# Patient Record
Sex: Female | Born: 1965
Health system: Southern US, Community
[De-identification: ages and names within clinical notes are randomized; demographics above are authoritative.]

## PROBLEM LIST (undated history)

## (undated) DIAGNOSIS — M797 Fibromyalgia: Secondary | ICD-10-CM

## (undated) DIAGNOSIS — K219 Gastro-esophageal reflux disease without esophagitis: Secondary | ICD-10-CM

## (undated) DIAGNOSIS — M1990 Unspecified osteoarthritis, unspecified site: Secondary | ICD-10-CM

## (undated) DIAGNOSIS — R112 Nausea with vomiting, unspecified: Secondary | ICD-10-CM

## (undated) DIAGNOSIS — R079 Chest pain, unspecified: Secondary | ICD-10-CM

## (undated) DIAGNOSIS — Z9889 Other specified postprocedural states: Secondary | ICD-10-CM

## (undated) DIAGNOSIS — I10 Essential (primary) hypertension: Secondary | ICD-10-CM

## (undated) DIAGNOSIS — E039 Hypothyroidism, unspecified: Secondary | ICD-10-CM

## (undated) HISTORY — DX: Hypothyroidism, unspecified: E03.9

## (undated) HISTORY — PX: NASAL SINUS SURGERY: SHX719

## (undated) HISTORY — DX: Fibromyalgia: M79.7

## (undated) HISTORY — DX: Essential (primary) hypertension: I10

## (undated) HISTORY — PX: BREAST ENHANCEMENT SURGERY: SHX7

## (undated) HISTORY — PX: AUGMENTATION MAMMAPLASTY: SUR837

## (undated) HISTORY — PX: EYE SURGERY: SHX253

## (undated) HISTORY — PX: LEFT OOPHORECTOMY: SHX1961

---

## 1993-02-12 HISTORY — PX: ABDOMINAL HYSTERECTOMY: SHX81

## 2000-06-13 ENCOUNTER — Encounter: Payer: Self-pay | Admitting: Gastroenterology

## 2000-06-13 ENCOUNTER — Encounter: Admission: RE | Admit: 2000-06-13 | Discharge: 2000-06-13 | Payer: Self-pay | Admitting: Gastroenterology

## 2000-07-03 ENCOUNTER — Ambulatory Visit (HOSPITAL_COMMUNITY): Admission: RE | Admit: 2000-07-03 | Discharge: 2000-07-03 | Payer: Self-pay | Admitting: Gastroenterology

## 2002-02-12 HISTORY — PX: AUGMENTATION MAMMAPLASTY: SUR837

## 2004-02-29 ENCOUNTER — Ambulatory Visit: Payer: Self-pay | Admitting: Family Medicine

## 2004-05-24 ENCOUNTER — Ambulatory Visit: Payer: Self-pay | Admitting: Urology

## 2004-11-15 ENCOUNTER — Ambulatory Visit: Payer: Self-pay | Admitting: Internal Medicine

## 2005-03-15 ENCOUNTER — Ambulatory Visit: Payer: Self-pay | Admitting: Otolaryngology

## 2008-10-25 ENCOUNTER — Ambulatory Visit: Payer: Self-pay | Admitting: Gastroenterology

## 2009-10-20 ENCOUNTER — Ambulatory Visit: Payer: Self-pay | Admitting: Unknown Physician Specialty

## 2009-10-24 ENCOUNTER — Ambulatory Visit: Payer: Self-pay | Admitting: Unknown Physician Specialty

## 2009-12-16 ENCOUNTER — Ambulatory Visit: Payer: Self-pay

## 2010-01-02 ENCOUNTER — Ambulatory Visit: Payer: Self-pay

## 2011-06-07 ENCOUNTER — Ambulatory Visit: Payer: Self-pay

## 2011-09-19 LAB — CBC
HCT: 37.3 % (ref 35.0–47.0)
HGB: 13.2 g/dL (ref 12.0–16.0)
MCH: 32.5 pg (ref 26.0–34.0)
MCHC: 35.5 g/dL (ref 32.0–36.0)
MCV: 92 fL (ref 80–100)
Platelet: 208 10*3/uL (ref 150–440)
RBC: 4.07 10*6/uL (ref 3.80–5.20)
RDW: 13.2 % (ref 11.5–14.5)
WBC: 6.4 10*3/uL (ref 3.6–11.0)

## 2011-09-19 LAB — BASIC METABOLIC PANEL
Anion Gap: 5 — ABNORMAL LOW (ref 7–16)
BUN: 11 mg/dL (ref 7–18)
Calcium, Total: 8.9 mg/dL (ref 8.5–10.1)
Chloride: 107 mmol/L (ref 98–107)
Co2: 31 mmol/L (ref 21–32)
Creatinine: 0.7 mg/dL (ref 0.60–1.30)
EGFR (African American): >60
EGFR (Non-African Amer.): >60
Glucose: 101 mg/dL — ABNORMAL HIGH (ref 65–99)
Osmolality: 285 (ref 275–301)
Potassium: 4.1 mmol/L (ref 3.5–5.1)
Sodium: 143 mmol/L (ref 136–145)

## 2011-09-19 LAB — TROPONIN I
Troponin-I: <0.02 ng/mL
Troponin-I: <0.02 ng/mL

## 2011-09-20 ENCOUNTER — Observation Stay: Payer: Self-pay | Admitting: Specialist

## 2011-09-20 LAB — TROPONIN I
Troponin-I: <0.02 ng/mL
Troponin-I: <0.02 ng/mL

## 2011-09-20 LAB — PROTIME-INR
INR: 1
Prothrombin Time: 14 secs (ref 11.5–14.7)

## 2011-09-20 LAB — LIPID PANEL
Cholesterol: 166 mg/dL (ref 0–200)
HDL Cholesterol: 38 mg/dL — ABNORMAL LOW (ref 40–60)
Ldl Cholesterol, Calc: 82 mg/dL (ref 0–100)
Triglycerides: 231 mg/dL — ABNORMAL HIGH (ref 0–200)
VLDL Cholesterol, Calc: 46 mg/dL — ABNORMAL HIGH (ref 5–40)

## 2011-09-20 LAB — TSH: Thyroid Stimulating Horm: 2.65 u[IU]/mL

## 2011-09-20 LAB — APTT: Activated PTT: 30.3 secs (ref 23.6–35.9)

## 2012-02-21 ENCOUNTER — Other Ambulatory Visit: Payer: Self-pay | Admitting: Gastroenterology

## 2012-02-23 LAB — STOOL CULTURE

## 2012-09-12 ENCOUNTER — Ambulatory Visit: Payer: Self-pay | Admitting: Family Medicine

## 2013-02-27 ENCOUNTER — Ambulatory Visit: Payer: Self-pay | Admitting: Otolaryngology

## 2013-08-26 IMAGING — US US EXTREM LOW VENOUS BILAT
1 series · 14 of 24 positions shown · non-contrast
Comparison: none

REASON FOR EXAM: positive d dimer
COMMENTS:

PROCEDURE:     US  - US DOPPLER LOW EXTR BILATERAL  - September 19, 2011  [DATE]
RESULT:     History: Positive d-dimer.
Comparison Study: No recent.

[Series 1: us extrem low venous bilat · 0.10mm/px · 14 of 46 slices shown]
[im 1/46]
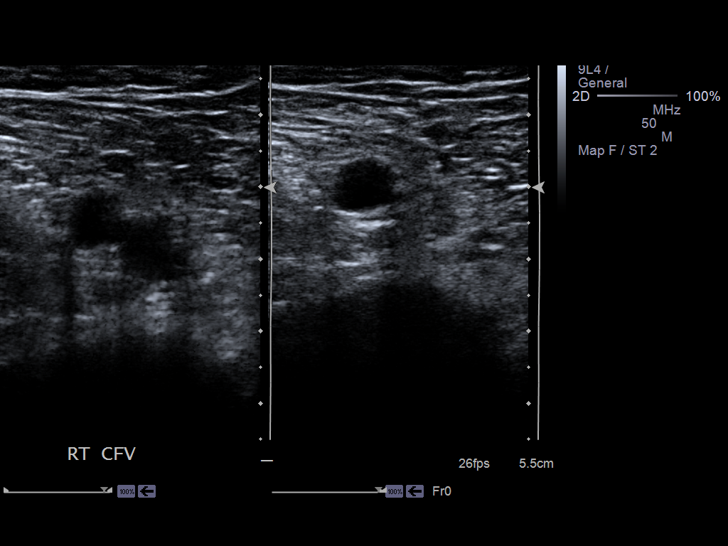
[im 4/46]
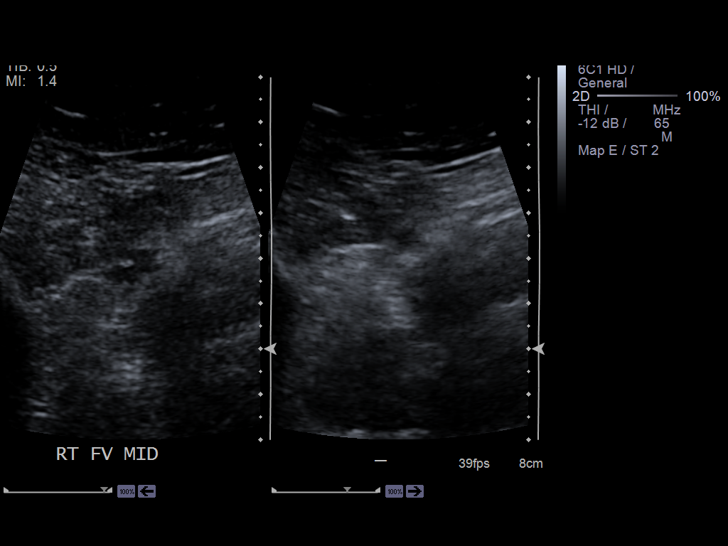
[im 8/46]
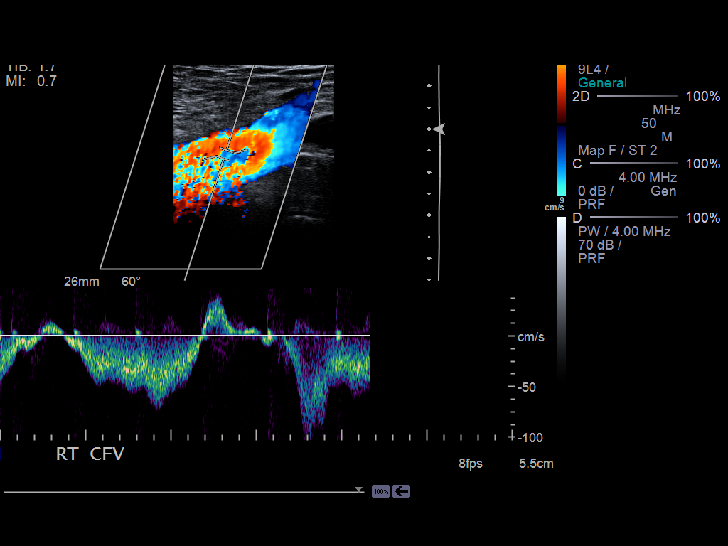
[im 12/46]
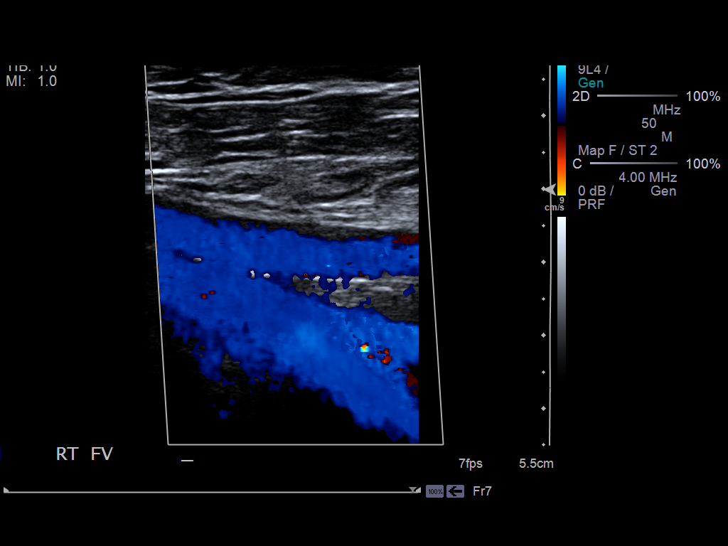
[im 14/46]
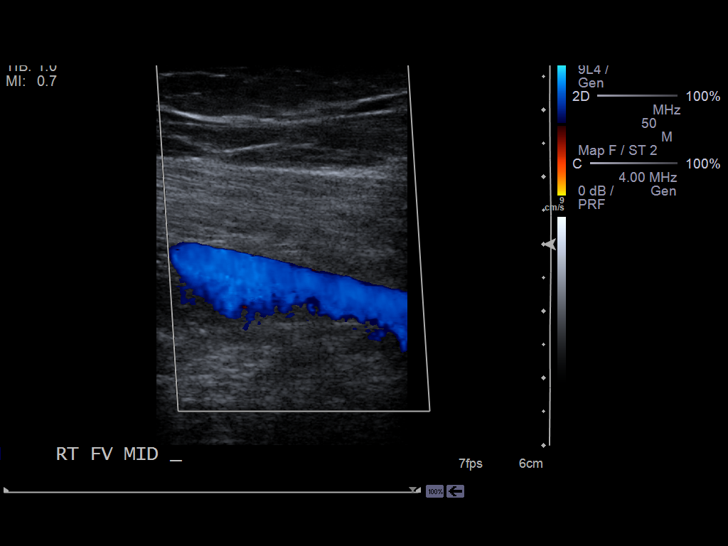
[im 18/46]
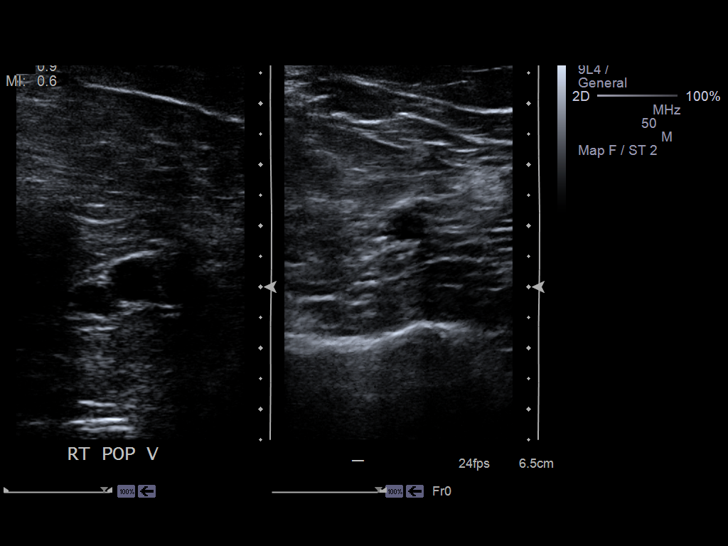
[im 22/46]
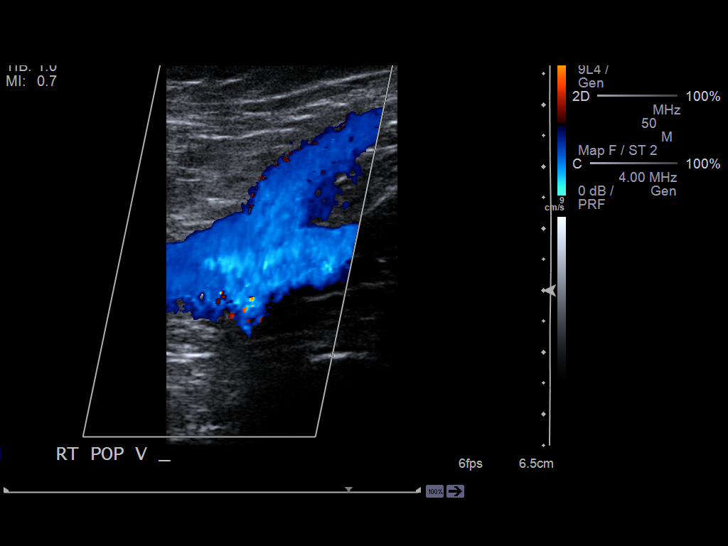
[im 24/46]
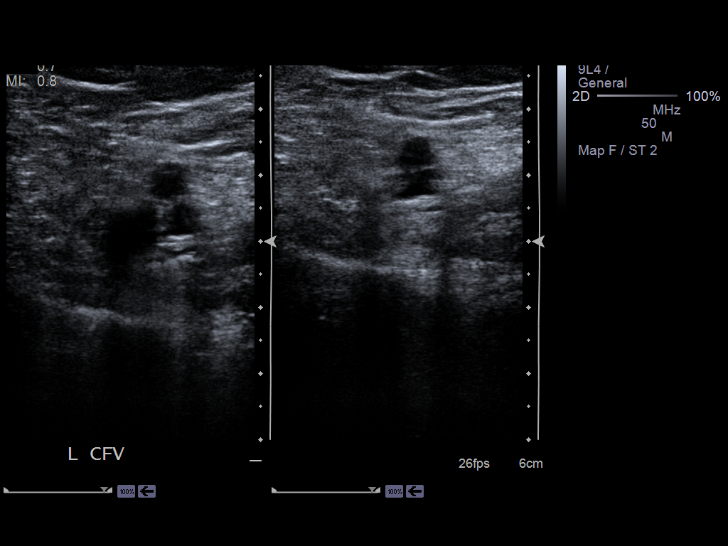
[im 28/46]
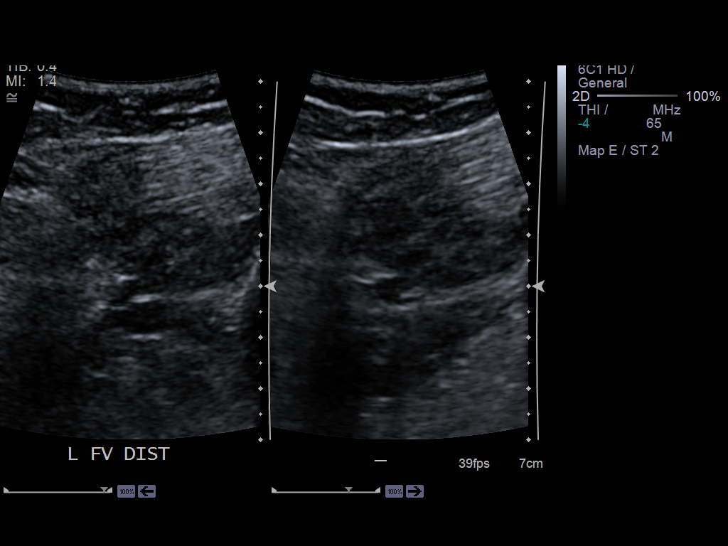
[im 32/46]
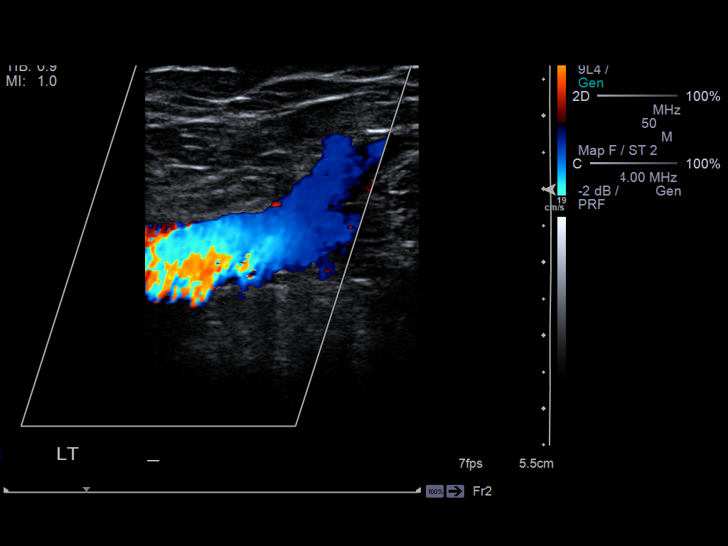
[im 36/46]
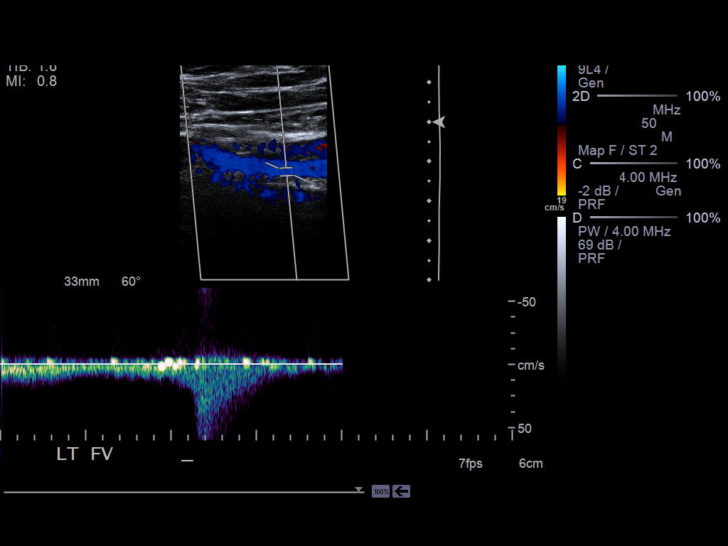
[im 38/46]
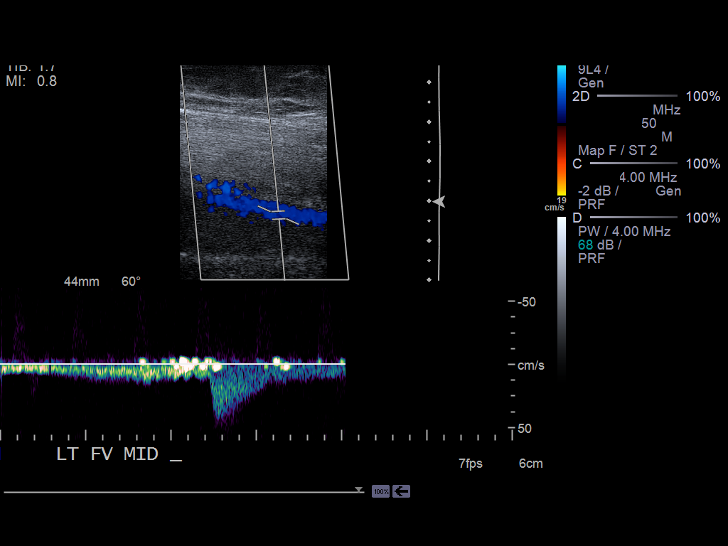
[im 42/46]
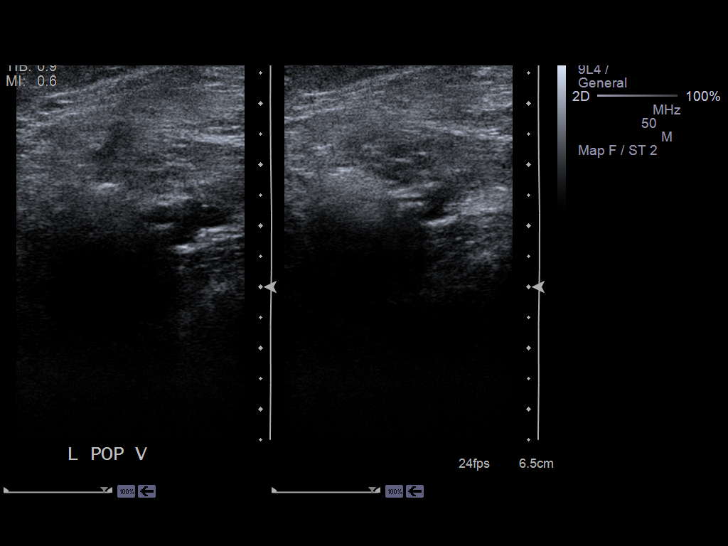
[im 46/46]
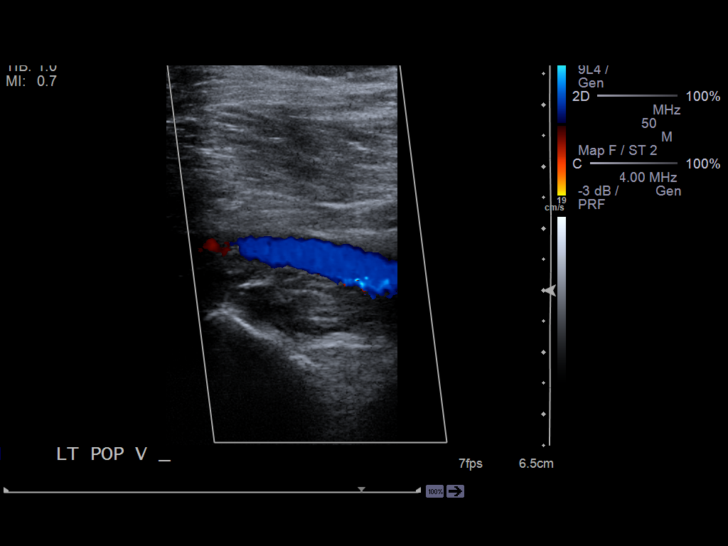

[14 of 24 positions shown; findings below may reference images not displayed]

FINDINGS: Bilateral lower extremity color flow duplex Doppler is negative
for deep venous thrombosis.
IMPRESSION: Negative exam.

## 2013-08-27 IMAGING — NM NM LUNG SCAN
2 series · 16 of 16 positions shown · non-contrast
Comparison: none

REASON FOR EXAM: Chest pain with elev dd imer
COMMENTS:

[Series 1000: lung ventilation · 3.90mm/px · 4 acquisitions, 8 frames shown]
[im 1/4]
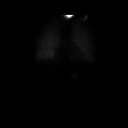
[im 1/4]
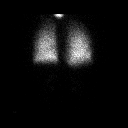
[im 2/4]
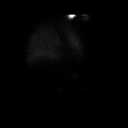
[im 2/4]
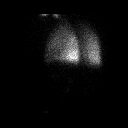
[im 3/4]
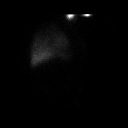
[im 3/4]
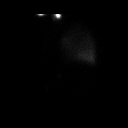
[im 4/4]
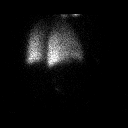
[im 4/4]
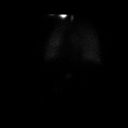

[Series 1000: lung perfusion · 1.95mm/px · 4 acquisitions, 8 frames shown]
[im 1/4]
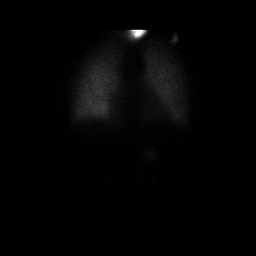
[im 1/4]
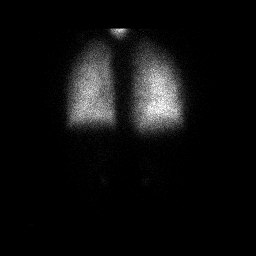
[im 2/4]
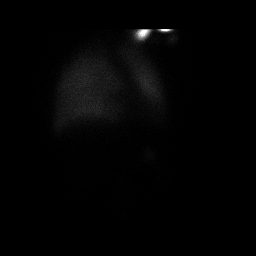
[im 2/4]
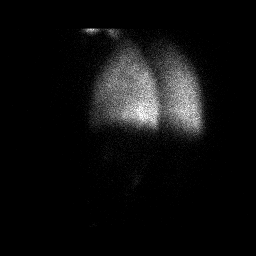
[im 3/4]
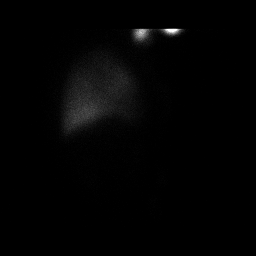
[im 3/4]
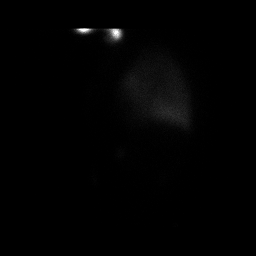
[im 4/4]
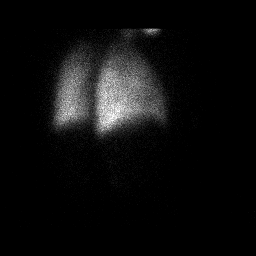
[im 4/4]
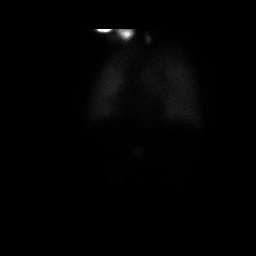

[16 of 16 positions shown; findings below may reference images not displayed]

PROCEDURE:     NM  - NM VQ LUNG SCAN  - [DATE]  [DATE] [DATE]  [DATE]

RESULT:     Comparison is made to the chest x-ray 19 September, 2011.

41.68 mCi of technetium 99 M DTPA were utilized for the ventilation images.
4.38 mCi of technetium 99 M MAA were injected for perfusion images.

Chest radiograph shows normal aeration. Ventilation pattern appears normal.
Perfusion pattern appears normal.
IMPRESSION: 1. Normal-appearing ventilation/perfusion lung scan.

[REDACTED]

## 2013-09-21 ENCOUNTER — Ambulatory Visit: Payer: Self-pay | Admitting: Family Medicine

## 2014-06-01 NOTE — Discharge Summary (Signed)
PATIENT NAME:  [name], Melanie E MR#:  [id number] DATE OF BIRTH:  05-05-1965  DATE OF ADMISSION:  09/20/2011 DATE OF DISCHARGE:  09/20/2011  For a detailed note, please see the history and physical done on admission by Dr. Tilda FrancoAkuneme.   DIAGNOSES AT DISCHARGE:  1. Chest pain, likely musculoskeletal in nature.  2. Migraines. 3. Asthma. 4. Rosacea.   DIET: The patient is being discharged on a regular diet.   ACTIVITY: As tolerated.   DISCHARGE MEDICATIONS:  1. Tylenol with hydrocodone 5/500 one tab as needed. 2. Aleve as needed.  3. Fioricet 2 tabs q.4 hours as needed for headache.   PERTINENT STUDIES DONE DURING THE HOSPITAL COURSE:  1. Chest x-ray done on admission showing no acute cardiopulmonary disease.  2. An ultrasound of her lower extremities showed no evidence of any DVT.  3. A V/Q scan which showed normal appearing V/Q scan with no abnormalities.   HOSPITAL COURSE: This is a 49 year old female who presented to the hospital with intermittent chest pain along with a headache.  1. Chest pain. The patient's chest pain is likely musculoskeletal in nature and probably related to stress. She was observed on telemetry, had three sets of cardiac markers checked which were negative. She had no acute EKG changes consistent with cardiac ischemia. She was noted to have a slightly elevated D-dimer, underwent Doppler's of her lower extremities which were negative. She could not have a CT scan of the chest with contrast as she is allergic to shellfish, therefore, she had a V/Q scan which was essentially normal. Her chest pain is likely musculoskeletal in nature and she will continue her pain meds as stated above.  2. Headache and nausea. This was likely suspected to be related to migraine headaches. She improved with some Zofran and some Fioricet. I did give her a prescription of Fioricet prior to discharge.  3. Asthma. There were no acute issues related to her asthma. She had no acute  exacerbation. She is on albuterol inhaler as needed at home. She will continue that.   CODE STATUS: The patient is a FULL CODE.   TIME SPENT: 35 minutes.   ____________________________ Rolly PancakeVivek J. Cherlynn KaiserSainani, MD vjs:drc D: 09/20/2011 16:07:18 ET T: 09/21/2011 10:52:43 ET JOB#: [id number]  cc: Rolly PancakeVivek J. Cherlynn KaiserSainani, MD, <Dictator> Houston SirenVIVEK J Zoya Sprecher MD ELECTRONICALLY SIGNED 09/21/2011 16:14

## 2014-06-01 NOTE — H&P (Signed)
PATIENT NAME:  Melanie Beltran, Melanie Beltran MR#:  [id number] DATE OF BIRTH:  1965-08-13  DATE OF ADMISSION:  09/20/2011  PRIMARY CARE PHYSICIAN: Dr. Marguerite Olea   ER PHYSICIAN: Dr. Marilynne Halsted    ADMITTING PHYSICIAN: Dr. Tilda Franco   PRESENTING COMPLAINT: Left-sided chest pain for the last three days.  HISTORY OF PRESENT ILLNESS: The patient is a 49 year old lady with complaint of left-sided chest pain for three days now. Pain was sharp in nature, initially left-sided radiating to the left shoulder with associated nausea but no vomiting. No dizziness. No blackouts. No palpitation. No PND, orthopnea, or pedal edema. Intensity was 10/10 relieved by rest. No aggravating factor for this. She presented to the Emergency Room today where she had EKG which showed normal sinus rhythm. Venous Doppler showed no DVT. D-dimer was elevated at 0.8. Preparation was being made for a CT scan, however, the patient was noted to be allergic to shellfish. She was referred to hospitalist for CT scan in a.m. Pain is relieved by morphine here in the ER. Denies any recent change in medication. No history of sick contacts. Admits to some long distance travel. Was in [place] about three weeks ago and to the beach about two weeks ago but no leg swelling during this episode. No rashes or trauma to the chest.  REVIEW OF SYSTEMS: CONSTITUTIONAL: Denies any fever or fatigue. No weakness. No weight loss or weight gain but only positive for the pain. EYES: No blurred vision, redness, or discharge. ENT: No tinnitus, epistaxis, or difficulty swallowing. RESPIRATORY: Denies any cough or wheezing. Admits to pain on deep inspiration. CARDIOVASCULAR: No PND, orthopnea, or pedal edema. No palpitations or syncope. GI: Positive for nausea but no vomiting, diarrhea, abdominal pain, or change in bowel habits. GU: No dysuria, frequency, or incontinence. ENDOCRINE: No polyuria, polydipsia, heat or cold intolerance. HEMATOLOGIC: No anemia, easy bruising, or swollen glands.  SKIN: No rashes, change in hair or skin texture. MUSCULOSKELETAL: No redness of joints, limited activity, swelling, or gout. NEUROLOGIC: No numbness, weakness, dementia, seizures, or memory loss. PSYCH: No anxiety, depression, or nervousness.    PAST MEDICAL HISTORY:  1. Asthma. 2. Rosacea.   PAST SURGICAL HISTORY:  1. Left arm surgery. 2. Sinus surgery.  3. History of cataract surgery.  4. Hysterectomy.   SOCIAL HISTORY: Lives at home with her family. No alcohol, tobacco, or recreational drug use. Owns a plumbing business.    FAMILY HISTORY: Reviewed and noncontributory.   ALLERGIES: Shellfish and Asacol.   MEDICATIONS: Aleve and acetaminophen which she takes p.r.n.   PHYSICAL EXAMINATION:   VITAL SIGNS: Temperature 98.9, pulse 91, respiratory rate 18, blood pressure 120/63, sats 98 on room air.   GENERAL: Young lady lying on the gurney awake, alert, oriented to time, place, and person in no distress. Family at bedside, supportive.   HEENT: Atraumatic, normocephalic. Pupils equal, reactive to light and accommodation. Extraocular movements intact. Mucous membranes pink, moist.   NECK: Supple. No JV distention.   CHEST: Good air entry. Clear to auscultation. There is tenderness left anterior chest wall.  HEART: Regular rate and rhythm. No murmurs.   ABDOMEN: Full, moves with respiration, nontender. Bowel sounds normoactive. No organomegaly.   EXTREMITIES: No edema. No clubbing. No deformity.   NEUROLOGICAL: No focal motor or sensory deficits.   PSYCH: Affect appropriate to situation.    SKIN: No rashes on the chest.  LABORATORY, DIAGNOSTIC, AND RADIOLOGICAL DATA: EKG showed normal sinus rhythm of 90, P wave inversion in V1 and V2.   Chest  x-ray showed no obvious infiltrate.   Venous Doppler bilateral lower extremities showed no DVT.   CBC unremarkable. Chemistry unremarkable. Creatinine 0.7, BUN 11, glucose 101, potassium 4.1. Troponin two sets negative. D-dimer is  elevated at 0.8.   IMPRESSION:  1. Chest pain, to rule out PE versus ACS. 2. Elevated D-dimer concerning for PE.  3. History of asthma, stable.  4. History of rosacea, stable.   PLAN:  1. Admit to general medical floor under observation. 2. V/Q scan in the a.m.  3. Serial cardiac enzymes.  4. Check TSH, magnesium, fasting lipid profile. 5. Aspirin and nitroglycerin. 6. P.r.n. morphine. 7. Lovenox for anticoagulation, weight based, 1 mg/kg subcu. 8. GI prophylaxis with Protonix.   CODE STATUS: FULL CODE.      TOTAL PATIENT CARE TIME: 50 minutes.   ____________________________ Floy SabinaMarcel I. Tilda FrancoAkuneme, MD mia:drc D: 09/20/2011 01:38:34 ET T: 09/20/2011 06:57:37 ET JOB#: [id number]  cc: Kioni Stahl I. Tilda FrancoAkuneme, MD, <Dictator> Durward MallardJoel B. Marguerite OleaMoffett, MD Margaret PyleMARCEL I Stokes Rattigan MD ELECTRONICALLY SIGNED 09/23/2011 20:29

## 2014-06-17 ENCOUNTER — Other Ambulatory Visit: Payer: Self-pay | Admitting: Adult Health

## 2014-06-17 ENCOUNTER — Other Ambulatory Visit: Payer: Self-pay | Admitting: Family Medicine

## 2014-06-17 DIAGNOSIS — M7989 Other specified soft tissue disorders: Secondary | ICD-10-CM

## 2014-06-18 ENCOUNTER — Ambulatory Visit
Admission: RE | Admit: 2014-06-18 | Discharge: 2014-06-18 | Disposition: A | Discharge status: Home / Self Care | Payer: BLUE CROSS/BLUE SHIELD | Source: Ambulatory Visit | Attending: Family Medicine | Admitting: Family Medicine

## 2014-06-18 DIAGNOSIS — M7989 Other specified soft tissue disorders: Secondary | ICD-10-CM | POA: Diagnosis not present

## 2014-06-22 ENCOUNTER — Ambulatory Visit: Payer: Self-pay

## 2015-11-09 ENCOUNTER — Other Ambulatory Visit: Payer: Self-pay | Admitting: Adult Health

## 2015-11-09 DIAGNOSIS — R51 Headache: Principal | ICD-10-CM

## 2015-11-09 DIAGNOSIS — R519 Headache, unspecified: Secondary | ICD-10-CM

## 2015-11-10 ENCOUNTER — Ambulatory Visit
Admission: RE | Admit: 2015-11-10 | Discharge: 2015-11-10 | Disposition: A | Discharge status: Home / Self Care | Payer: Commercial Managed Care - HMO | Source: Ambulatory Visit | Attending: Adult Health | Admitting: Adult Health

## 2015-11-10 ENCOUNTER — Encounter: Payer: Self-pay | Admitting: Radiology

## 2015-11-10 DIAGNOSIS — G936 Cerebral edema: Secondary | ICD-10-CM | POA: Insufficient documentation

## 2015-11-10 DIAGNOSIS — R519 Headache, unspecified: Secondary | ICD-10-CM

## 2015-11-10 DIAGNOSIS — R51 Headache: Secondary | ICD-10-CM | POA: Insufficient documentation

## 2015-11-10 DIAGNOSIS — Z9889 Other specified postprocedural states: Secondary | ICD-10-CM | POA: Diagnosis not present

## 2015-11-10 DIAGNOSIS — R930 Abnormal findings on diagnostic imaging of skull and head, not elsewhere classified: Secondary | ICD-10-CM | POA: Diagnosis not present

## 2015-11-10 MED ORDER — GADOBENATE DIMEGLUMINE 529 MG/ML IV SOLN
15.0000 mL | Freq: Once | INTRAVENOUS | Status: AC | PRN
  Administered 2015-11-10: 15 mL via INTRAVENOUS

## 2016-05-07 DIAGNOSIS — M7062 Trochanteric bursitis, left hip: Secondary | ICD-10-CM | POA: Diagnosis not present

## 2016-05-07 DIAGNOSIS — M791 Myalgia: Secondary | ICD-10-CM | POA: Diagnosis not present

## 2016-07-23 ENCOUNTER — Emergency Department: Payer: 59

## 2016-07-23 ENCOUNTER — Emergency Department
Admission: EM | Admit: 2016-07-23 | Discharge: 2016-07-23 | Disposition: A | Discharge status: Home / Self Care | Payer: 59 | Attending: Student in an Organized Health Care Education/Training Program | Admitting: Student in an Organized Health Care Education/Training Program

## 2016-07-23 ENCOUNTER — Encounter: Payer: Self-pay | Admitting: Emergency Medicine

## 2016-07-23 DIAGNOSIS — Z79899 Other long term (current) drug therapy: Secondary | ICD-10-CM | POA: Diagnosis not present

## 2016-07-23 DIAGNOSIS — R0602 Shortness of breath: Secondary | ICD-10-CM | POA: Diagnosis not present

## 2016-07-23 DIAGNOSIS — R51 Headache: Secondary | ICD-10-CM | POA: Insufficient documentation

## 2016-07-23 DIAGNOSIS — R079 Chest pain, unspecified: Secondary | ICD-10-CM | POA: Insufficient documentation

## 2016-07-23 DIAGNOSIS — R0789 Other chest pain: Secondary | ICD-10-CM | POA: Diagnosis not present

## 2016-07-23 DIAGNOSIS — R519 Headache, unspecified: Secondary | ICD-10-CM

## 2016-07-23 LAB — BASIC METABOLIC PANEL
Anion gap: 9 (ref 5–15)
BUN: 13 mg/dL (ref 6–20)
CHLORIDE: 104 mmol/L (ref 101–111)
CO2: 26 mmol/L (ref 22–32)
Calcium: 9.2 mg/dL (ref 8.9–10.3)
Creatinine, Ser: 0.65 mg/dL (ref 0.44–1.00)
GFR calc Af Amer: >60 mL/min (ref >60)
GFR calc non Af Amer: >60 mL/min (ref >60)
Glucose, Bld: 88 mg/dL (ref 65–99)
POTASSIUM: 4.3 mmol/L (ref 3.5–5.1)
SODIUM: 139 mmol/L (ref 135–145)

## 2016-07-23 LAB — TROPONIN I: Troponin I: <0.03 ng/mL (ref <0.03)

## 2016-07-23 LAB — CBC
HEMATOCRIT: 42.7 % (ref 35.0–47.0)
HEMOGLOBIN: 15 g/dL (ref 12.0–16.0)
MCH: 31.5 pg (ref 26.0–34.0)
MCHC: 35.1 g/dL (ref 32.0–36.0)
MCV: 89.6 fL (ref 80.0–100.0)
Platelets: 164 10*3/uL (ref 150–440)
RBC: 4.76 MIL/uL (ref 3.80–5.20)
RDW: 13.2 % (ref 11.5–14.5)
WBC: 4.2 10*3/uL (ref 3.6–11.0)

## 2016-07-23 MED ORDER — DEXAMETHASONE SODIUM PHOSPHATE 10 MG/ML IJ SOLN
10.0000 mg | Freq: Once | INTRAMUSCULAR | Status: AC
  Administered 2016-07-23: 10 mg via INTRAVENOUS
  Filled 2016-07-23: qty 1

## 2016-07-23 MED ORDER — DIPHENHYDRAMINE HCL 50 MG/ML IJ SOLN
25.0000 mg | Freq: Once | INTRAMUSCULAR | Status: AC
  Administered 2016-07-23: 25 mg via INTRAVENOUS
  Filled 2016-07-23: qty 1

## 2016-07-23 MED ORDER — IPRATROPIUM-ALBUTEROL 0.5-2.5 (3) MG/3ML IN SOLN
3.0000 mL | Freq: Once | RESPIRATORY_TRACT | Status: AC
  Administered 2016-07-23: 3 mL via RESPIRATORY_TRACT
  Filled 2016-07-23: qty 3

## 2016-07-23 MED ORDER — PROCHLORPERAZINE EDISYLATE 5 MG/ML IJ SOLN
10.0000 mg | Freq: Once | INTRAMUSCULAR | Status: AC
  Administered 2016-07-23: 10 mg via INTRAVENOUS
  Filled 2016-07-23: qty 2

## 2016-07-23 NOTE — ED Provider Notes (Signed)
[facility] Emergency Department Provider Note    First MD Initiated Contact with Patient 07/23/16 [phone]     (approximate)  I have reviewed the triage vital signs and the nursing notes.   HISTORY  Chief Complaint Chest Pain    HPI ROMELLE MULDOON is a 51 y.o. female who presents with headache as well as left-sided chest pain that started roughly 2-3 days ago and is associated with shortness of breath. States she also has a headache right now that feels consistent with previous migraine headaches for which she sees a neurologist. Denies any fevers. No cough. No exertional pain. States that it is a tightness in the left side of her chest. No worsening of pain or shortness of breath with exertion. No worsening with leaning forward or lying back. She does have some associated nausea this morning which she has with her migraines. Denies any orthopnea or lower extremity swelling.   PMH:   Fibromyalgia  FMH: no bleeding disorders Past Surgical History:  Procedure Laterality Date  . ABDOMINAL HYSTERECTOMY     There are no active problems to display for this patient.     Prior to Admission medications   Medication Sig Start Date End Date Taking? Authorizing Provider  venlafaxine XR (EFFEXOR-XR) 75 MG 24 hr capsule Take 75 mg by mouth daily with breakfast.   Yes [provider]    Allergies Patient has no known allergies.    Social History Social History  Substance Use Topics  . Smoking status: Not on file  . Smokeless tobacco: Not on file  . Alcohol use Not on file    Review of Systems Patient denies headaches, rhinorrhea, blurry vision, numbness, shortness of breath, chest pain, edema, cough, abdominal pain, nausea, vomiting, diarrhea, dysuria, fevers, rashes or hallucinations unless otherwise stated above in HPI. ____________________________________________   PHYSICAL EXAM:  VITAL SIGNS: Vitals:   07/23/16 1315 07/23/16 1330    BP:  134/78  Pulse: 89 83  Resp: 20 18  Temp:      Constitutional: Alert and oriented. Well appearing and in no acute distress. Eyes: Conjunctivae are normal.  Head: Atraumatic. Nose: No congestion/rhinnorhea. Mouth/Throat: Mucous membranes are moist.   Neck: No stridor. Painless ROM.  Cardiovascular: Normal rate, regular rhythm. Grossly normal heart sounds.  Good peripheral circulation. Respiratory: Normal respiratory effort.  No retractions. Lungs CTAB. Gastrointestinal: Soft and nontender. No distention. No abdominal bruits. No CVA tenderness. Musculoskeletal: No lower extremity tenderness nor edema.  No joint effusions. Neurologic:  Normal speech and language. No gross focal neurologic deficits are appreciated. No facial droop Skin:  Skin is warm, dry and intact. No rash noted. Psychiatric: Mood and affect are normal. Speech and behavior are normal.  ____________________________________________   LABS (all labs ordered are listed, but only abnormal results are displayed)  Results for orders placed or performed during the hospital encounter of 07/23/16 (from the past 24 hour(s))  Basic metabolic panel     Status: None   Collection Time: 07/23/16  8:33 AM  Result Value Ref Range   Sodium 139 135 - 145 mmol/L   Potassium 4.3 3.5 - 5.1 mmol/L   Chloride 104 101 - 111 mmol/L   CO2 26 22 - 32 mmol/L   Glucose, Bld 88 65 - 99 mg/dL   BUN 13 6 - 20 mg/dL   Creatinine, Ser 4.69 0.44 - 1.00 mg/dL   Calcium 9.2 8.9 - 62.9 mg/dL   GFR calc non Af Amer >60 >  60 mL/min   GFR calc Af Amer >60 >60 mL/min   Anion gap 9 5 - 15  Troponin I     Status: None   Collection Time: 07/23/16  8:33 AM  Result Value Ref Range   Troponin I <0.03 <0.03 ng/mL  CBC     Status: None   Collection Time: 07/23/16  9:29 AM  Result Value Ref Range   WBC 4.2 3.6 - 11.0 K/uL   RBC 4.76 3.80 - 5.20 MIL/uL   Hemoglobin 15.0 12.0 - 16.0 g/dL   HCT 62.942.7 52.835.0 - 41.347.0 %   MCV 89.6 80.0 - 100.0 fL   MCH 31.5  26.0 - 34.0 pg   MCHC 35.1 32.0 - 36.0 g/dL   RDW 24.413.2 01.011.5 - 27.214.5 %   Platelets 164 150 - 440 K/uL  Troponin I     Status: None   Collection Time: 07/23/16 11:15 AM  Result Value Ref Range   Troponin I <0.03 <0.03 ng/mL   ____________________________________________  EKG My review and personal interpretation at Time: 7:58   Indication: chestpain  Rate: 70  Rhythm: sinus Axis: normal Other: normal intervals, no acute stemi or depressions ____________________________________________  RADIOLOGY  I personally reviewed all radiographic images ordered to evaluate for the above acute complaints and reviewed radiology reports and findings.  These findings were personally discussed with the patient.  Please see medical record for radiology report.  ____________________________________________   PROCEDURES  Procedure(s) performed:  Procedures    Critical Care performed: no ____________________________________________   INITIAL IMPRESSION / ASSESSMENT AND PLAN / ED COURSE  Pertinent labs & imaging results that were available during my care of the patient were reviewed by me and considered in my medical decision making (see chart for details).  DDX: ACS, pericarditis, esophagitis, boerhaaves, pe, dissection, pna, bronchitis, costochondritis   Ria CommentLaurie E Shoff is a 51 y.o. who presents to the ED with left-sided chest pain for the past 2-3 days. She is afebrile hemodynamically stable. Patient is fairly anxious appearing but is in no respiratory distress. Her EKG shows no evidence of acute ischemia. She is low risk by well's criteria and is PERC negative.  She has a Heart score of 2 (10100). Will provide migraine cocktail and she states that she has had chest pain associated with migraines and her fibromyalgia before. This is not clinically consistent with pericarditis. She has no signs or symptoms of dissection.  The patient will be placed on continuous pulse oximetry and telemetry for  monitoring.  Laboratory evaluation will be sent to evaluate for the above complaints.    Clinical Course as of Jul 23 1557  Mon Jul 23, 2016  1336 Patient reassessed. Repeat troponin is negative.  Patient states her headache has improved significantly. States that she has a little bit of tightness in her chest but that is also significantly resolved. I have recommended patient stay for further treatment of the patient is requesting discharge home. She is hemodynamically stable with reassuring blood work and nonfocal exam and do feel that she is appropriate for further evaluation and management as an outpatient.  [PR]    Clinical Course User Index [PR] Willy Eddyobinson, Tracey Hermance, MD     ____________________________________________   FINAL CLINICAL IMPRESSION(S) / ED DIAGNOSES  Final diagnoses:  Chest pain, unspecified type  Acute nonintractable headache, unspecified headache type      NEW MEDICATIONS STARTED DURING THIS VISIT:  Discharge Medication List as of 07/23/2016  1:50 PM  Note:  This document was prepared using Dragon voice recognition software and may include unintentional dictation errors.    Willy Eddy, MD 07/23/16 [phone]

## 2016-07-23 NOTE — ED Triage Notes (Signed)
Chest pain, sternal to L shoulder began 2 days ago and increasing. SOB but speaking full sentences.

## 2016-07-23 NOTE — ED Notes (Signed)
Patient transported to X-ray 

## 2016-07-23 NOTE — Discharge Instructions (Signed)

## 2016-07-27 DIAGNOSIS — H1132 Conjunctival hemorrhage, left eye: Secondary | ICD-10-CM | POA: Diagnosis not present

## 2016-07-30 DIAGNOSIS — G4700 Insomnia, unspecified: Secondary | ICD-10-CM | POA: Diagnosis not present

## 2016-07-30 DIAGNOSIS — G43719 Chronic migraine without aura, intractable, without status migrainosus: Secondary | ICD-10-CM | POA: Diagnosis not present

## 2016-07-30 DIAGNOSIS — I10 Essential (primary) hypertension: Secondary | ICD-10-CM | POA: Diagnosis not present

## 2016-10-30 DIAGNOSIS — G43719 Chronic migraine without aura, intractable, without status migrainosus: Secondary | ICD-10-CM | POA: Diagnosis not present

## 2016-10-30 DIAGNOSIS — I10 Essential (primary) hypertension: Secondary | ICD-10-CM | POA: Diagnosis not present

## 2016-11-05 DIAGNOSIS — M5481 Occipital neuralgia: Secondary | ICD-10-CM | POA: Diagnosis not present

## 2016-11-05 DIAGNOSIS — M791 Myalgia: Secondary | ICD-10-CM | POA: Diagnosis not present

## 2016-11-05 DIAGNOSIS — G43719 Chronic migraine without aura, intractable, without status migrainosus: Secondary | ICD-10-CM | POA: Diagnosis not present

## 2016-12-17 DIAGNOSIS — K219 Gastro-esophageal reflux disease without esophagitis: Secondary | ICD-10-CM | POA: Diagnosis not present

## 2016-12-17 DIAGNOSIS — M797 Fibromyalgia: Secondary | ICD-10-CM | POA: Diagnosis not present

## 2016-12-17 DIAGNOSIS — I10 Essential (primary) hypertension: Secondary | ICD-10-CM | POA: Diagnosis not present

## 2017-01-23 DIAGNOSIS — M797 Fibromyalgia: Secondary | ICD-10-CM | POA: Diagnosis not present

## 2017-01-23 DIAGNOSIS — G4700 Insomnia, unspecified: Secondary | ICD-10-CM | POA: Diagnosis not present

## 2017-01-23 DIAGNOSIS — R0602 Shortness of breath: Secondary | ICD-10-CM | POA: Diagnosis not present

## 2017-01-29 DIAGNOSIS — G43719 Chronic migraine without aura, intractable, without status migrainosus: Secondary | ICD-10-CM | POA: Diagnosis not present

## 2017-01-29 DIAGNOSIS — M5481 Occipital neuralgia: Secondary | ICD-10-CM | POA: Diagnosis not present

## 2017-04-03 DIAGNOSIS — M5481 Occipital neuralgia: Secondary | ICD-10-CM | POA: Diagnosis not present

## 2017-04-04 DIAGNOSIS — M5481 Occipital neuralgia: Secondary | ICD-10-CM | POA: Diagnosis not present

## 2017-05-01 DIAGNOSIS — G43719 Chronic migraine without aura, intractable, without status migrainosus: Secondary | ICD-10-CM | POA: Diagnosis not present

## 2017-05-01 DIAGNOSIS — R238 Other skin changes: Secondary | ICD-10-CM | POA: Diagnosis not present

## 2017-05-01 DIAGNOSIS — M5481 Occipital neuralgia: Secondary | ICD-10-CM | POA: Diagnosis not present

## 2017-05-01 DIAGNOSIS — E559 Vitamin D deficiency, unspecified: Secondary | ICD-10-CM | POA: Diagnosis not present

## 2017-05-07 DIAGNOSIS — G4700 Insomnia, unspecified: Secondary | ICD-10-CM | POA: Diagnosis not present

## 2017-05-07 DIAGNOSIS — I10 Essential (primary) hypertension: Secondary | ICD-10-CM | POA: Diagnosis not present

## 2017-05-18 DIAGNOSIS — J209 Acute bronchitis, unspecified: Secondary | ICD-10-CM | POA: Diagnosis not present

## 2017-05-18 DIAGNOSIS — J019 Acute sinusitis, unspecified: Secondary | ICD-10-CM | POA: Diagnosis not present

## 2017-05-18 DIAGNOSIS — B9689 Other specified bacterial agents as the cause of diseases classified elsewhere: Secondary | ICD-10-CM | POA: Diagnosis not present

## 2017-06-04 DIAGNOSIS — R5381 Other malaise: Secondary | ICD-10-CM | POA: Diagnosis not present

## 2017-06-04 DIAGNOSIS — R945 Abnormal results of liver function studies: Secondary | ICD-10-CM | POA: Diagnosis not present

## 2017-06-04 DIAGNOSIS — R5383 Other fatigue: Secondary | ICD-10-CM | POA: Diagnosis not present

## 2017-06-11 DIAGNOSIS — E039 Hypothyroidism, unspecified: Secondary | ICD-10-CM | POA: Diagnosis not present

## 2017-06-11 DIAGNOSIS — I10 Essential (primary) hypertension: Secondary | ICD-10-CM | POA: Diagnosis not present

## 2017-07-04 DIAGNOSIS — K582 Mixed irritable bowel syndrome: Secondary | ICD-10-CM | POA: Diagnosis not present

## 2017-07-04 DIAGNOSIS — K210 Gastro-esophageal reflux disease with esophagitis: Secondary | ICD-10-CM | POA: Diagnosis not present

## 2017-07-04 DIAGNOSIS — R194 Change in bowel habit: Secondary | ICD-10-CM | POA: Diagnosis not present

## 2017-10-03 DIAGNOSIS — M5481 Occipital neuralgia: Secondary | ICD-10-CM | POA: Diagnosis not present

## 2017-10-15 DIAGNOSIS — E039 Hypothyroidism, unspecified: Secondary | ICD-10-CM | POA: Diagnosis not present

## 2017-10-17 DIAGNOSIS — E039 Hypothyroidism, unspecified: Secondary | ICD-10-CM | POA: Diagnosis not present

## 2017-10-17 DIAGNOSIS — I10 Essential (primary) hypertension: Secondary | ICD-10-CM | POA: Diagnosis not present

## 2017-11-19 DIAGNOSIS — E039 Hypothyroidism, unspecified: Secondary | ICD-10-CM | POA: Diagnosis not present

## 2017-11-19 DIAGNOSIS — E559 Vitamin D deficiency, unspecified: Secondary | ICD-10-CM | POA: Diagnosis not present

## 2017-12-12 DIAGNOSIS — E039 Hypothyroidism, unspecified: Secondary | ICD-10-CM | POA: Diagnosis not present

## 2017-12-12 DIAGNOSIS — R635 Abnormal weight gain: Secondary | ICD-10-CM | POA: Diagnosis not present

## 2017-12-18 DIAGNOSIS — J069 Acute upper respiratory infection, unspecified: Secondary | ICD-10-CM | POA: Diagnosis not present

## 2017-12-20 DIAGNOSIS — J0100 Acute maxillary sinusitis, unspecified: Secondary | ICD-10-CM | POA: Diagnosis not present

## 2018-01-21 DIAGNOSIS — R600 Localized edema: Secondary | ICD-10-CM | POA: Diagnosis not present

## 2018-01-21 DIAGNOSIS — E039 Hypothyroidism, unspecified: Secondary | ICD-10-CM | POA: Diagnosis not present

## 2018-01-21 DIAGNOSIS — M791 Myalgia, unspecified site: Secondary | ICD-10-CM | POA: Diagnosis not present

## 2018-03-06 ENCOUNTER — Ambulatory Visit (INDEPENDENT_AMBULATORY_CARE_PROVIDER_SITE_OTHER): Payer: 59 | Admitting: Certified Nurse Midwife

## 2018-03-06 ENCOUNTER — Encounter: Payer: Self-pay | Admitting: Certified Nurse Midwife

## 2018-03-06 VITALS — BP 133/88 | HR 84 | Ht 62.0 in | Wt 189.4 lb

## 2018-03-06 DIAGNOSIS — N644 Mastodynia: Secondary | ICD-10-CM

## 2018-03-06 DIAGNOSIS — Z01419 Encounter for gynecological examination (general) (routine) without abnormal findings: Secondary | ICD-10-CM

## 2018-03-06 DIAGNOSIS — R102 Pelvic and perineal pain unspecified side: Secondary | ICD-10-CM

## 2018-03-06 DIAGNOSIS — Z1231 Encounter for screening mammogram for malignant neoplasm of breast: Secondary | ICD-10-CM | POA: Diagnosis not present

## 2018-03-06 DIAGNOSIS — N941 Unspecified dyspareunia: Secondary | ICD-10-CM

## 2018-03-06 DIAGNOSIS — R232 Flushing: Secondary | ICD-10-CM

## 2018-03-06 NOTE — Patient Instructions (Addendum)
WE WOULD LOVE TO HEAR FROM YOU!!!!   Thank you Ivory Broad for visiting [facility].  Providing our patients with the best experience possible is really important to [place], and we hope that you felt that on your recent visit. The most valuable feedback we get comes from [place]!!    If you receive a survey please take a couple of minutes to let us know how we did.Thank you for continuing to trust [place] with your care.   [facility]    Pelvic Pain, Female Pelvic pain is pain in your lower belly (abdomen), below your belly button and between your hips. The pain may start suddenly (be acute), keep coming back (be recurring), or last a long time (become chronic). Pelvic pain that lasts longer than 6 months is called chronic pelvic pain. There are many causes of pelvic pain. Sometimes the cause of pelvic pain is not known. Follow these instructions at home:   Take over-the-counter and prescription medicines only as told by your doctor.  Rest as told by your doctor.  Do not have sex if it hurts.  Keep a journal of your pelvic pain. Write down: ? When the pain started. ? Where the pain is located. ? What seems to make the pain better or worse, such as food or your period (menstrual cycle). ? Any symptoms you have along with the pain.  Keep all follow-up visits as told by your doctor. This is important. Contact a doctor if:  Medicine does not help your pain.  Your pain comes back.  You have new symptoms.  You have unusual discharge or bleeding from your vagina.  You have a fever or chills.  You are having trouble pooping (constipation).  You have blood in your pee (urine) or poop (stool).  Your pee smells bad.  You feel weak or light-headed. Get help right away if:  You have sudden pain that is very bad.  Your pain keeps getting worse.  You have very bad pain and also have any of these symptoms: ? A fever. ? Feeling sick to your stomach  (nausea). ? Throwing up (vomiting). ? Being very sweaty.  You pass out (lose consciousness). Summary  Pelvic pain is pain in your lower belly (abdomen), below your belly button and between your hips.  There are many possible causes of pelvic pain.  Keep a journal of your pelvic pain. This information is not intended to replace advice given to you by your health care provider. Make sure you discuss any questions you have with your health care provider. Document Released: 07/18/2007 Document Revised: 07/17/2017 Document Reviewed: 07/17/2017 Elsevier Interactive Patient Education  2019 [employer] Breast Tenderness Breast tenderness is a common problem for women of all ages. Breast tenderness may cause mild discomfort to severe pain. The pain usually comes and goes in association with your menstrual cycle, but it can be constant. Breast tenderness has many possible causes, including hormone changes and some medicines. Your health care provider may order tests, such as a mammogram or an ultrasound, to check for any unusual findings. Having breast tenderness usually does not mean that you have breast cancer. Follow these instructions at home: Sometimes, reassurance that you do not have breast cancer is all that is needed. In general, follow these home care instructions: Managing pain and discomfort   If directed, apply ice to the area: ? Put ice in a plastic bag. ? Place a towel between your skin and the bag. ?  Leave the ice on for 20 minutes, 2-3 times a day.  Make sure you are wearing a supportive bra, especially during exercise. You may also want to wear a supportive bra while sleeping if your breasts are very tender. Medicines  Take over-the-counter and prescription medicines only as told by your health care provider. If the cause of your pain is infection, you may be prescribed an antibiotic medicine.  If you were prescribed an antibiotic, take it as told by your health care  provider. Do not stop taking the antibiotic even if you start to feel better. General instructions   Your health care provider may recommend that you reduce the amount of fat in your diet. You can do this by: ? Limiting fried foods. ? Cooking foods using methods, such as baking, boiling, grilling, and broiling.  Decrease the amount of caffeine in your diet. You can do this by drinking more water and choosing caffeine-free options.  Keep a log of the days and times when your breasts are most tender.  Ask your health care provider how to do breast exams at home. This will help you notice if you have an unusual growth or lump. Contact a health care provider if:  Any part of your breast is hard, red, and hot to the touch. This may be a sign of infection.  You are not breastfeeding and you have fluid, especially blood or pus, coming out of your nipples.  You have a fever.  You have a new or painful lump in your breast that remains after your menstrual period ends.  Your pain does not improve or it gets worse.  Your pain is interfering with your daily activities. This information is not intended to replace advice given to you by your health care provider. Make sure you discuss any questions you have with your health care provider. Document Released: 01/12/2008 Document Revised: 10/28/2015 Document Reviewed: 10/28/2015 Elsevier Interactive Patient Education  2019 [place] Years, Female Preventive care refers to lifestyle choices and visits with your health care provider that can promote health and wellness. What does preventive care include?   A yearly physical exam. This is also called an annual well check.  Dental exams once or twice a year.  Routine eye exams. Ask your health care provider how often you should have your eyes checked.  Personal lifestyle choices, including: ? Daily care of your teeth and gums. ? Regular physical activity. ? Eating a  healthy diet. ? Avoiding tobacco and drug use. ? Limiting alcohol use. ? Practicing safe sex. ? Taking low-dose aspirin daily starting at age 65. ? Taking vitamin and mineral supplements as recommended by your health care provider. What happens during an annual well check? The services and screenings done by your health care provider during your annual well check will depend on your age, overall health, lifestyle risk factors, and family history of disease. Counseling Your health care provider may ask you questions about your:  Alcohol use.  Tobacco use.  Drug use.  Emotional well-being.  Home and relationship well-being.  Sexual activity.  Eating habits.  Work and work Statistician.  Method of birth control.  Menstrual cycle.  Pregnancy history. Screening You may have the following tests or measurements:  Height, weight, and BMI.  Blood pressure.  Lipid and cholesterol levels. These may be checked every 5 years, or more frequently if you are over 29 years old.  Skin check.  Lung cancer screening. You may have this  screening every year starting at age 66 if you have a 30-pack-year history of smoking and currently smoke or have quit within the past 15 years.  Colorectal cancer screening. All adults should have this screening starting at age 51 and continuing until age 75. Your health care provider may recommend screening at age 83. You will have tests every 1-10 years, depending on your results and the type of screening test. People at increased risk should start screening at an earlier age. Screening tests may include: ? Guaiac-based fecal occult blood testing. ? Fecal immunochemical test (FIT). ? Stool DNA test. ? Virtual colonoscopy. ? Sigmoidoscopy. During this test, a flexible tube with a tiny camera (sigmoidoscope) is used to examine your rectum and lower colon. The sigmoidoscope is inserted through your anus into your rectum and lower colon. ? Colonoscopy.  During this test, a long, thin, flexible tube with a tiny camera (colonoscope) is used to examine your entire colon and rectum.  Hepatitis C blood test.  Hepatitis B blood test.  Sexually transmitted disease (STD) testing.  Diabetes screening. This is done by checking your blood sugar (glucose) after you have not eaten for a while (fasting). You may have this done every 1-3 years.  Mammogram. This may be done every 1-2 years. Talk to your health care provider about when you should start having regular mammograms. This may depend on whether you have a family history of breast cancer.  BRCA-related cancer screening. This may be done if you have a family history of breast, ovarian, tubal, or peritoneal cancers.  Pelvic exam and Pap test. This may be done every 3 years starting at age 25. Starting at age 36, this may be done every 5 years if you have a Pap test in combination with an HPV test.  Bone density scan. This is done to screen for osteoporosis. You may have this scan if you are at high risk for osteoporosis. Discuss your test results, treatment options, and if necessary, the need for more tests with your health care provider. Vaccines Your health care provider may recommend certain vaccines, such as:  Influenza vaccine. This is recommended every year.  Tetanus, diphtheria, and acellular pertussis (Tdap, Td) vaccine. You may need a Td booster every 10 years.  Varicella vaccine. You may need this if you have not been vaccinated.  Zoster vaccine. You may need this after age 28.  Measles, mumps, and rubella (MMR) vaccine. You may need at least one dose of MMR if you were born in 1957 or later. You may also need a second dose.  Pneumococcal 13-valent conjugate (PCV13) vaccine. You may need this if you have certain conditions and were not previously vaccinated.  Pneumococcal polysaccharide (PPSV23) vaccine. You may need one or two doses if you smoke cigarettes or if you have certain  conditions.  Meningococcal vaccine. You may need this if you have certain conditions.  Hepatitis A vaccine. You may need this if you have certain conditions or if you travel or work in places where you may be exposed to hepatitis A.  Hepatitis B vaccine. You may need this if you have certain conditions or if you travel or work in places where you may be exposed to hepatitis B.  Haemophilus influenzae type b (Hib) vaccine. You may need this if you have certain conditions. Talk to your health care provider about which screenings and vaccines you need and how often you need them. This information is not intended to replace advice given to you  by your health care provider. Make sure you discuss any questions you have with your health care provider. Document Released: 02/25/2015 Document Revised: 03/21/2017 Document Reviewed: 11/30/2014 Elsevier Interactive Patient Education  2019 [employer].

## 2018-03-06 NOTE — Progress Notes (Signed)
Patient here for AE, c/o major hot flashes that started 4 months ago, bilateral breast sorenes that started 3 weeks ago and left sided intermittent pelvic pain that started 3 months ago.

## 2018-03-06 NOTE — Progress Notes (Signed)
ANNUAL PREVENTATIVE CARE GYN  ENCOUNTER NOTE  Subjective:       Melanie Beltran CommentLaurie E Decou is a 53 y.o. [id number] female here for a routine annual gynecologic exam.  Current complaints: 1.  Needs Mammogram 2.  Pelvic pain 3.  Hot flashes 4.  Breast soreness 5.  Dyspareunia   Reports "major hot flashes" for the last four (4) months and minimal relief with home treatment measures. Bilateral breast soreness for the last three (3) weeks. Right sided pelvic pain and dyspareunia with penetration and thrusting for the last three (3) months.   Denies difficulty breathing or respiratory distress, chest pain, vaginal bleeding, dysuria, and leg pain or swelling.   Gynecologic History  No LMP recorded. Patient has had a hysterectomy.  Contraception: status post hysterectomy  Last Pap: 2018. Results were: normal  Last mammogram: 2018. Results were: normal  Obstetric History  OB History  Gravida Para Term Preterm AB Living  2 2 2  0 0 2  SAB TAB Ectopic Multiple Live Births  0 0 0 0 2    # Outcome Date GA Lbr Len/2nd Weight Sex Delivery Anes PTL Lv  2 Term 03/27/91   7 lb 4 oz (3.289 kg) F Vag-Spont   LIV  1 Term 04/08/89   7 lb 2 oz (3.232 kg) F Vag-Spont   LIV    Past Medical History:  Diagnosis Date  . Fibromyalgia   . Hypertension   . Hypothyroidism     Past Surgical History:  Procedure Laterality Date  . ABDOMINAL HYSTERECTOMY  1995  . LEFT OOPHORECTOMY    . NASAL SINUS SURGERY      Current Outpatient Medications on File Prior to Visit  Medication Sig Dispense Refill  . DEXILANT 60 MG capsule Take 60 mg by mouth daily.    Marland Kitchen. levothyroxine (SYNTHROID, LEVOTHROID) 50 MCG tablet Take 50 mcg by mouth daily.    . metoprolol succinate (TOPROL-XL) 25 MG 24 hr tablet Take 25 mg by mouth daily.     No current facility-administered medications on file prior to visit.     Allergies  Allergen Reactions  . Mesalamine Anaphylaxis    Throat closes  . Shellfish Allergy     Social  History   Socioeconomic History  . Marital status: Married    Spouse name: Not on file  . Number of children: Not on file  . Years of education: Not on file  . Highest education level: Not on file  Occupational History  . Not on file  Social Needs  . Financial resource strain: Not on file  . Food insecurity:    Worry: Not on file    Inability: Not on file  . Transportation needs:    Medical: Not on file    Non-medical: Not on file  Tobacco Use  . Smoking status: Never Smoker  . Smokeless tobacco: Never Used  Substance and Sexual Activity  . Alcohol use: Yes    Comment: occasional   . Drug use: Never  . Sexual activity: Yes    Birth control/protection: Surgical  Lifestyle  . Physical activity:    Days per week: Not on file    Minutes per session: Not on file  . Stress: Not on file  Relationships  . Social connections:    Talks on phone: Not on file    Gets together: Not on file    Attends religious service: Not on file    Active member of club or organization: Not on file  Attends meetings of clubs or organizations: Not on file    Relationship status: Not on file  . Intimate partner violence:    Fear of current or ex partner: Not on file    Emotionally abused: Not on file    Physically abused: Not on file    Forced sexual activity: Not on file  Other Topics Concern  . Not on file  Social History Narrative  . Not on file    Family History  Problem Relation Age of Onset  . Cancer Mother   . Non-Hodgkin's lymphoma Father   . Breast cancer Neg Hx   . Ovarian cancer Neg Hx   . Colon cancer Neg Hx     The following portions of the patient's history were reviewed and updated as appropriate: allergies, current medications, past family history, past medical history, past social history, past surgical history and problem list.  Review of Systems  ROS negative except as noted above. Information obtained from patient.    Objective:   BP 133/88   Pulse 84    Ht 5\' 2"  (1.575 m)   Wt 189 lb 6.4 oz (85.9 kg)   BMI 34.64 kg/m    CONSTITUTIONAL: Well-developed, well-nourished female in no acute distress.   PSYCHIATRIC: Normal mood and affect. Normal behavior. Normal judgment and thought content.  NEUROLGIC: Alert and oriented to person, place, and time. Normal muscle tone coordination. No cranial nerve deficit noted.  HENT:  Normocephalic, atraumatic, External right and left ear normal.   EYES: Conjunctivae and EOM are normal. Pupils are equal, round, and reactive to light. No scleral icterus.   NECK: Normal range of motion, supple, no masses.  Normal thyroid.   SKIN: Skin is warm and dry. No rash noted. Not diaphoretic. No erythema. No pallor. Surgical scars present.  CARDIOVASCULAR: Normal heart rate noted, regular rhythm, no murmur.  RESPIRATORY: Clear to auscultation bilaterally. Effort and breath sounds normal, no problems with respiration noted.  BREASTS: Symmetric in size. No masses, skin changes, nipple drainage, or lymphadenopathy. Bilateral breast implants present.   ABDOMEN: Soft, normal bowel sounds, no distention noted.  No tenderness, rebound or guarding.   PELVIC:  External Genitalia: Normal  BUS: Normal  Vagina: Normal  Cervix: Normal  Uterus: Normal  Adnexa: Normal  MUSCULOSKELETAL: Normal range of motion. No tenderness.  No cyanosis, clubbing, or edema.  2+ distal pulses.  LYMPHATIC: No Axillary, Supraclavicular, or Inguinal Adenopathy.  Assessment:   Annual gynecologic examination 53 y.o.   Contraception: status post hysterectomy   Obesity 1   Problem List Items Addressed This Visit    None    Visit Diagnoses    Well woman exam    -  Primary   Relevant Orders   FSH/LH   Estradiol   Ambulatory referral to Physical Therapy   Encounter for screening mammogram for breast cancer       Pelvic pain       Relevant Orders   Ambulatory referral to Physical Therapy   Hot flashes       Relevant Medications    metoprolol succinate (TOPROL-XL) 25 MG 24 hr tablet   Other Relevant Orders   FSH/LH   Estradiol   Soreness breast       Dyspareunia, female       Relevant Orders   Ambulatory referral to Physical Therapy      Plan:   Pap: Not needed  Mammogram: Ordered  Labs: See orders   Education regarding menopause symptoms treatment  options; handouts given  Routine preventative health maintenance measures emphasized: Exercise/Diet/Weight control, Tobacco Warnings, Alcohol/Substance use risks and Stress Management; see AVS  Reviewed red flag symptoms and when to call  RTC x 1-2 for ultrasound and follow up  RTC x 1 year for ANNUAL EXAM or sooner if needed.    Gunnar BullaJenkins Michelle Esterlene Atiyeh, CNM [facility], [id number] 03/06/18 4:20 PM

## 2018-03-07 LAB — ESTRADIOL: Estradiol: <5 pg/mL

## 2018-03-07 LAB — FSH/LH
FSH: 76.5 m[IU]/mL
LH: 43.3 m[IU]/mL

## 2018-03-25 ENCOUNTER — Ambulatory Visit (INDEPENDENT_AMBULATORY_CARE_PROVIDER_SITE_OTHER): Payer: 59

## 2018-03-25 ENCOUNTER — Ambulatory Visit: Payer: 59 | Admitting: Certified Nurse Midwife

## 2018-03-25 ENCOUNTER — Encounter: Payer: Self-pay | Admitting: Certified Nurse Midwife

## 2018-03-25 VITALS — BP 134/86 | HR 77 | Ht 62.0 in | Wt 184.4 lb

## 2018-03-25 DIAGNOSIS — N951 Menopausal and female climacteric states: Secondary | ICD-10-CM | POA: Diagnosis not present

## 2018-03-25 DIAGNOSIS — N941 Unspecified dyspareunia: Secondary | ICD-10-CM

## 2018-03-25 DIAGNOSIS — Z01419 Encounter for gynecological examination (general) (routine) without abnormal findings: Secondary | ICD-10-CM | POA: Diagnosis not present

## 2018-03-25 DIAGNOSIS — R102 Pelvic and perineal pain: Secondary | ICD-10-CM

## 2018-03-25 MED ORDER — PAROXETINE HCL 20 MG PO TABS
20.0000 mg | ORAL_TABLET | Freq: Every day | ORAL | 1 refills | Status: DC
Start: 2018-03-25 — End: 2018-05-12

## 2018-03-25 NOTE — Patient Instructions (Addendum)
WE WOULD LOVE TO HEAR FROM YOU!!!!   Thank you Ria Comment for visiting [facility].  Providing our patients with the best experience possible is really important to [place], and we hope that you felt that on your recent visit. The most valuable feedback we get comes from YOU!!    If you receive a survey please take a couple of minutes to let us know how we did.Thank you for continuing to trust [place] with your care.   [facility]   Paroxetine tablets What is this medicine? PAROXETINE (pa ROX e teen) is used to treat depression. It may also be used to treat anxiety disorders, obsessive compulsive disorder, panic attacks, post traumatic stress, and premenstrual dysphoric disorder (PMDD). This medicine may be used for other purposes; ask your health care provider or pharmacist if you have questions. COMMON BRAND NAME(S): Paxil, Pexeva What should I tell my health care provider before I take this medicine? They need to know if you have any of these conditions: -bipolar disorder or a family history of bipolar disorder -bleeding disorders -glaucoma -heart disease -kidney disease -liver disease -low levels of sodium in the blood -seizures -suicidal thoughts, plans, or attempt; a previous suicide attempt by you or a family member -take MAOIs like Carbex, Eldepryl, Marplan, Nardil, and Parnate -take medicines that treat or prevent blood clots -thyroid disease -an unusual or allergic reaction to paroxetine, other medicines, foods, dyes, or preservatives -pregnant or trying to get pregnant -breast-feeding How should I use this medicine? Take this medicine by mouth with a glass of water. Follow the directions on the prescription label. You can take it with or without food. Take your medicine at regular intervals. Do not take your medicine more often than directed. Do not stop taking this medicine suddenly except upon the advice of your doctor. Stopping this  medicine too quickly may cause serious side effects or your condition may worsen. A special MedGuide will be given to you by the pharmacist with each prescription and refill. Be sure to read this information carefully each time. Talk to your pediatrician regarding the use of this medicine in children. Special care may be needed. Overdosage: If you think you have taken too much of this medicine contact a poison control center or emergency room at once. NOTE: This medicine is only for you. Do not share this medicine with others. What if I miss a dose? If you miss a dose, take it as soon as you can. If it is almost time for your next dose, take only that dose. Do not take double or extra doses. What may interact with this medicine? Do not take this medicine with any of the following medications: -linezolid -MAOIs like Carbex, Eldepryl, Marplan, Nardil, and Parnate -methylene blue (injected into a vein) -pimozide -thioridazine This medicine may also interact with the following medications: -alcohol -amphetamines -aspirin and aspirin-like medicines -atomoxetine -certain medicines for depression, anxiety, or psychotic disturbances -certain medicines for irregular heart beat like propafenone, flecainide, encainide, and quinidine -certain medicines for migraine headache like almotriptan, eletriptan, frovatriptan, naratriptan, rizatriptan, sumatriptan, zolmitriptan -cimetidine -digoxin -diuretics -fentanyl -fosamprenavir -furazolidone -isoniazid -lithium -medicines that treat or prevent blood clots like warfarin, enoxaparin, and dalteparin -medicines for sleep -NSAIDs, medicines for pain and inflammation, like ibuprofen or naproxen -phenobarbital -phenytoin -procarbazine -rasagiline -ritonavir -supplements like St. John's wort, kava kava, valerian -tamoxifen -tramadol -tryptophan This list may not describe all possible interactions. Give your health care provider a list of all the  medicines,  herbs, non-prescription drugs, or dietary supplements you use. Also tell them if you smoke, drink alcohol, or use illegal drugs. Some items may interact with your medicine. What should I watch for while using this medicine? Tell your doctor if your symptoms do not get better or if they get worse. Visit your doctor or health care professional for regular checks on your progress. Because it may take several weeks to see the full effects of this medicine, it is important to continue your treatment as prescribed by your doctor. Patients and their families should watch out for new or worsening thoughts of suicide or depression. Also watch out for sudden changes in feelings such as feeling anxious, agitated, panicky, irritable, hostile, aggressive, impulsive, severely restless, overly excited and hyperactive, or not being able to sleep. If this happens, especially at the beginning of treatment or after a change in dose, call your health care professional. Bonita Quin may get drowsy or dizzy. Do not drive, use machinery, or do anything that needs mental alertness until you know how this medicine affects you. Do not stand or sit up quickly, especially if you are an older patient. This reduces the risk of dizzy or fainting spells. Alcohol may interfere with the effect of this medicine. Avoid alcoholic drinks. Your mouth may get dry. Chewing sugarless gum or sucking hard candy, and drinking plenty of water will help. Contact your doctor if the problem does not go away or is severe. What side effects may I notice from receiving this medicine? Side effects that you should report to your doctor or health care professional as soon as possible: -allergic reactions like skin rash, itching or hives, swelling of the face, lips, or tongue -anxious -black, tarry stools -changes in vision -confusion -elevated mood, decreased need for sleep, racing thoughts, impulsive behavior -eye pain -fast, irregular  heartbeat -feeling faint or lightheaded, falls -feeling agitated, angry, or irritable -hallucination, loss of contact with reality -loss of balance or coordination -loss of memory -painful or prolonged erections -restlessness, pacing, inability to keep still -seizures -stiff muscles -suicidal thoughts or other mood changes -trouble sleeping -unusual bleeding or bruising -unusually weak or tired -vomiting Side effects that usually do not require medical attention (report to your doctor or health care professional if they continue or are bothersome): -change in appetite or weight -change in sex drive or performance -diarrhea -dizziness -dry mouth -increased sweating -indigestion, nausea -tired -tremors This list may not describe all possible side effects. Call your doctor for medical advice about side effects. You may report side effects to FDA at 1-800-FDA-1088. Where should I keep my medicine? Keep out of the reach of children. Store at room temperature between 15 and 30 degrees C (59 and 86 degrees F). Keep container tightly closed. Throw away any unused medicine after the expiration date. NOTE: This sheet is a summary. It may not cover all possible information. If you have questions about this medicine, talk to your doctor, pharmacist, or health care provider.  2019 Elsevier/Gold Standard (2015-07-02 15:50:32) Menopause Menopause is the normal time of life when menstrual periods stop completely. It is usually confirmed by 12 months without a menstrual period. The transition to menopause (perimenopause) most often happens between the ages of 39 and 51. During perimenopause, hormone levels change in your body, which can cause symptoms and affect your health. Menopause may increase your risk for:  Loss of bone (osteoporosis), which causes bone breaks (fractures).  Depression.  Hardening and narrowing of the arteries (atherosclerosis), which can cause  heart attacks and  strokes. What are the causes? This condition is usually caused by a natural change in hormone levels that happens as you get older. The condition may also be caused by surgery to remove both ovaries (bilateral oophorectomy). What increases the risk? This condition is more likely to start at an earlier age if you have certain medical conditions or treatments, including:  A tumor of the pituitary gland in the brain.  A disease that affects the ovaries and hormone production.  Radiation treatment for cancer.  Certain cancer treatments, such as chemotherapy or hormone (anti-estrogen) therapy.  Heavy smoking and excessive alcohol use.  Family history of early menopause. This condition is also more likely to develop earlier in women who are very thin. What are the signs or symptoms? Symptoms of this condition include:  Hot flashes.  Irregular menstrual periods.  Night sweats.  Changes in feelings about sex. This could be a decrease in sex drive or an increased comfort around your sexuality.  Vaginal dryness and thinning of the vaginal walls. This may cause painful intercourse.  Dryness of the skin and development of wrinkles.  Headaches.  Problems sleeping (insomnia).  Mood swings or irritability.  Memory problems.  Weight gain.  Hair growth on the face and chest.  Bladder infections or problems with urinating. How is this diagnosed? This condition is diagnosed based on your medical history, a physical exam, your age, your menstrual history, and your symptoms. Hormone tests may also be done. How is this treated? In some cases, no treatment is needed. You and your health care provider should make a decision together about whether treatment is necessary. Treatment will be based on your individual condition and preferences. Treatment for this condition focuses on managing symptoms. Treatment may include:  Menopausal hormone therapy (MHT).  Medicines to treat specific  symptoms or complications.  Acupuncture.  Vitamin or herbal supplements. Before starting treatment, make sure to let your health care provider know if you have a personal or family history of:  Heart disease.  Breast cancer.  Blood clots.  Diabetes.  Osteoporosis. Follow these instructions at home: Lifestyle  Do not use any products that contain nicotine or tobacco, such as cigarettes and e-cigarettes. If you need help quitting, ask your health care provider.  Get at least 30 minutes of physical activity on 5 or more days each week.  Avoid alcoholic and caffeinated beverages, as well as spicy foods. This may help prevent hot flashes.  Get 7-8 hours of sleep each night.  If you have hot flashes, try: ? Dressing in layers. ? Avoiding things that may trigger hot flashes, such as spicy food, warm places, or stress. ? Taking slow, deep breaths when a hot flash starts. ? Keeping a fan in your home and office.  Find ways to manage stress, such as deep breathing, meditation, or journaling.  Consider going to group therapy with other women who are having menopause symptoms. Ask your health care provider about recommended group therapy meetings. Eating and drinking  Eat a healthy, balanced diet that contains whole grains, lean protein, low-fat dairy, and plenty of fruits and vegetables.  Your health care provider may recommend adding more soy to your diet. Foods that contain soy include tofu, tempeh, and soy milk.  Eat plenty of foods that contain calcium and vitamin D for bone health. Items that are rich in calcium include low-fat milk, yogurt, beans, almonds, sardines, broccoli, and kale. Medicines  Take over-the-counter and prescription medicines only as  told by your health care provider.  Talk with your health care provider before starting any herbal supplements. If prescribed, take vitamins and supplements as told by your health care provider. These may include: ? Calcium.  Women age 59 and older should get 1,200 mg (milligrams) of calcium every day. ? Vitamin D. Women need 600-800 International Units of vitamin D each day. ? Vitamins B12 and B6. Aim for 50 micrograms of B12 and 1.5 mg of B6 each day. General instructions  Keep track of your menstrual periods, including: ? When they occur. ? How heavy they are and how long they last. ? How much time passes between periods.  Keep track of your symptoms, noting when they start, how often you have them, and how long they last.  Use vaginal lubricants or moisturizers to help with vaginal dryness and improve comfort during sex.  Keep all follow-up visits as told by your health care provider. This is important. This includes any group therapy or counseling. Contact a health care provider if:  You are still having menstrual periods after age 15.  You have pain during sex.  You have not had a period for 12 months and you develop vaginal bleeding. Get help right away if:  You have: ? Severe depression. ? Excessive vaginal bleeding. ? Pain when you urinate. ? A fast or irregular heart beat (palpitations). ? Severe headaches. ? Abdomen (abdominal) pain or severe indigestion.  You fell and you think you have a broken bone.  You develop leg or chest pain.  You develop vision problems.  You feel a lump in your breast. Summary  Menopause is the normal time of life when menstrual periods stop completely. It is usually confirmed by 12 months without a menstrual period.  The transition to menopause (perimenopause) most often happens between the ages of 11 and 78.  Symptoms can be managed through medicines, lifestyle changes, and complementary therapies such as acupuncture.  Eat a balanced diet that is rich in nutrients to promote bone health and heart health and to manage symptoms during menopause. This information is not intended to replace advice given to you by your health care provider. Make sure you  discuss any questions you have with your health care provider. Document Released: 04/21/2003 Document Revised: 03/03/2016 Document Reviewed: 03/03/2016 Elsevier Interactive Patient Education  2019 [employer] Dyspareunia, Female  Dyspareunia is pain that is associated with sexual activity. This can affect any part of the genitals or lower abdomen, and there are many possible causes. This condition ranges from mild to severe. Depending on the cause, dyspareunia may get better with treatment, or it may return (recur) over time. What are the causes? The cause of this condition is not always known. Possible causes include:  Cancer.  Psychological factors, such as depression, anxiety, or previous traumatic experiences.  Severe pain and tenderness of the skin around the vagina (vulva) when it is touched (vulvar vestibulitis syndrome).  Infection of the pelvis or the vulva.  Infection of the vagina.  Painful, involuntary tightening (contraction) of the vaginal muscles when anything is put inside the vagina (vaginismus).  Allergic reaction.  Ovarian cysts.  Solid growths of tissue (tumors) in the ovaries or the uterus.  Scar tissue in the ovaries, vagina, or pelvis.  Vaginal dryness.  Thinning of the tissue (atrophy) of the vulva and vagina.  Skin conditions that affect the vulva (vulvar dermatoses), such as lichen sclerosus or lichen planus.  Endometriosis.  Tubal pregnancy.  A tilted uterus.  Uterine prolapse.  Adhesions in the vagina.  Bladder problems.  Intestinal problems.  Certain medicines.  Medical conditions such as diabetes, arthritis, or thyroid disease. What increases the risk? The following factors may make you more likely to develop this condition:  Having experienced physical or sexual trauma.  Having given birth more than once.  Taking birth control pills.  Having gone through menopause.  Having recently given birth, typically within the past  3-6 months.  Breastfeeding. What are the signs or symptoms? The main symptom of this condition is pain in any part of the genitals or lower abdomen during or after sexual activity. This may include pain during sexual arousal, genital stimulation, or orgasm. Pain may get worse when anything is inserted into the vagina, or when the genitals are touched in any way, such as when sitting or wearing pants. Pain can range from mild to severe, depending on the cause of the condition. In some cases, symptoms go away with treatment and return (recur) at a later date. How is this diagnosed? This condition may be diagnosed based on:  Your symptoms, including: ? Where your pain is located. ? When your pain occurs.  Your medical history.  A physical exam. This may include a pelvic exam and a Pap test. This is a screening test that is used to check for signs of cancer of the vagina, cervix, and uterus.  Tests, including: ? Blood tests. ? Ultrasound. This uses sound waves to make a picture of the area that is being tested. ? Urine culture. This test involves checking a urine sample for signs of infection. ? Culture test. This is when your health care provider uses a swab to collect a sample of vaginal fluid. The sample is checked for signs of infection. ? X-rays. ? MRI. ? CT scan. ? Laparoscopy. This is a procedure in which a small incision is made in your lower abdomen and a lighted, pencil-sized instrument (laparoscope) is passed through the incision and used to look inside your pelvis. You may be referred to a health care provider who specializes in women's health (gynecologist). In some cases, diagnosing the cause of dyspareunia can be difficult. How is this treated? Treatment depends on the cause of your condition and your symptoms. In most cases, you may need to stop sexual activity until your symptoms improve. Treatment may include:  Lubricants.  Kegel exercises or vaginal  dilators.  Medicated skin creams.  Medicated vaginal creams.  Hormonal therapy.  Antibiotic medicine to prevent or fight infection.  Medicines that help to relieve pain.  Medicines that treat depression (antidepressants).  Psychological counseling.  Sex therapy.  Surgery. Follow these instructions at home: Lifestyle  Avoid tight clothing and irritating materials around your genital and abdominal area.  Use water-based lubricants as needed. Avoid oil-based lubricants.  Do not use any products that irritate you. This may include certain condoms, spermicides, lubricants, soaps, tampons, vaginal sprays, or douches.  Always practice safe sex. Talk with your health care provider about which form of birth control (contraception) is best for you.  Maintain open communication with your sexual partner. General instructions  Take over-the-counter and prescription medicines only as told by your health care provider.  If you had tests done, it is your responsibility to get your tests results. Ask your health care provider or the department performing the test when your results will be ready.  Urinate before you engage in sexual activity.  Consider joining a support group.  Keep all follow-up visits  as told by your health care provider. This is important. Contact a health care provider if:  You develop vaginal bleeding after sexual intercourse.  You develop a lump at the opening of your vagina. Seek medical care even if the lump is painless.  You have: ? Abnormal vaginal discharge. ? Vaginal dryness. ? Itchiness or irritation of your vulva or vagina. ? A new rash. ? Symptoms that get worse or do not improve with treatment. ? A fever. ? Pain when you urinate. ? Blood in your urine. Get help right away if:  You develop severe pain in your abdomen during or shortly after sexual intercourse.  You pass out after having sexual intercourse. This information is not intended to  replace advice given to you by your health care provider. Make sure you discuss any questions you have with your health care provider. Document Released: 02/18/2007 Document Revised: 06/10/2015 Document Reviewed: 08/31/2014 Elsevier Interactive Patient Education  [employer].

## 2018-03-25 NOTE — Progress Notes (Signed)
Patient here to discuss ultrasound report, c/o hot flashes and mood swings "for several months".

## 2018-03-25 NOTE — Progress Notes (Signed)
GYN ENCOUNTER NOTE  Subjective:       Melanie Beltran is a 53 y.o. G32P2002 female here for follow up and results review regarding pelvic pain, dyspareunia, hot flashes and mood changes.   Annual exam was 03/06/2018. Reported pelvic pain and dyspareunia for the last three (3) months. Notes mood changes and hot flashes for the last four (4) months, reports not feeling like herself. No relief with home treatment measures.   Denies difficulty breathing or respiratory distress, chest pain, abdominal pain, vaginal bleeding, dysuria, and leg pain or swelling.    Gynecologic History  No LMP recorded. Patient has had a hysterectomy.  Contraception: none  Last Pap: 2018. Results were: normal  Last mammogram: 2018. Results were: normal  Obstetric History  OB History  Gravida Para Term Preterm AB Living  2 2 2  0 0 2  SAB TAB Ectopic Multiple Live Births  0 0 0 0 2    # Outcome Date GA Lbr Len/2nd Weight Sex Delivery Anes PTL Lv  2 Term 03/27/91   7 lb 4 oz (3.289 kg) F Vag-Spont   LIV  1 Term 04/08/89   7 lb 2 oz (3.232 kg) F Vag-Spont   LIV    Past Medical History:  Diagnosis Date  . Fibromyalgia   . Hypertension   . Hypothyroidism     Past Surgical History:  Procedure Laterality Date  . ABDOMINAL HYSTERECTOMY  1995  . LEFT OOPHORECTOMY    . NASAL SINUS SURGERY      Current Outpatient Medications on File Prior to Visit  Medication Sig Dispense Refill  . DEXILANT 60 MG capsule Take 60 mg by mouth daily.    Marland Kitchen levothyroxine (SYNTHROID, LEVOTHROID) 50 MCG tablet Take 50 mcg by mouth daily.    . metoprolol succinate (TOPROL-XL) 25 MG 24 hr tablet Take 25 mg by mouth daily.     No current facility-administered medications on file prior to visit.     Allergies  Allergen Reactions  . Mesalamine Anaphylaxis    Throat closes  . Shellfish Allergy     Social History   Socioeconomic History  . Marital status: Married    Spouse name: Not on file  . Number of children: Not  on file  . Years of education: Not on file  . Highest education level: Not on file  Occupational History  . Not on file  Social Needs  . Financial resource strain: Not on file  . Food insecurity:    Worry: Not on file    Inability: Not on file  . Transportation needs:    Medical: Not on file    Non-medical: Not on file  Tobacco Use  . Smoking status: Never Smoker  . Smokeless tobacco: Never Used  Substance and Sexual Activity  . Alcohol use: Yes    Comment: occasional    . Drug use: Never  . Sexual activity: Yes    Birth control/protection: Surgical  Lifestyle  . Physical activity:    Days per week: Not on file    Minutes per session: Not on file  . Stress: Not on file  Relationships  . Social connections:    Talks on phone: Not on file    Gets together: Not on file    Attends religious service: Not on file    Active member of club or organization: Not on file    Attends meetings of clubs or organizations: Not on file    Relationship status: Not on file  .  Intimate partner violence:    Fear of current or ex partner: Not on file    Emotionally abused: Not on file    Physically abused: Not on file    Forced sexual activity: Not on file  Other Topics Concern  . Not on file  Social History Narrative  . Not on file    Family History  Problem Relation Age of Onset  . Cancer Mother   . Non-Hodgkin's lymphoma Father   . Breast cancer Neg Hx   . Ovarian cancer Neg Hx   . Colon cancer Neg Hx     The following portions of the patient's history were reviewed and updated as appropriate: allergies, current medications, past family history, past medical history, past social history, past surgical history and problem list.  Review of Systems  ROS negative except as noted above. Information obtained from patient.   Objective:   BP 134/86   Pulse 77   Ht 5\' 2"  (1.575 m)   Wt 184 lb 6.4 oz (83.6 kg)   BMI 33.73 kg/m    CONSTITUTIONAL: Well-developed, well-nourished  female in no acute distress.   ULTRASOUND REPORT  Location: [facility]  Date of Service: 03/25/2018   Indications:Pelvic Pain Findings:  The patient had a total hysterectomy and LT oopherectomy Right Ovary measures 1.7x1.9x1.3 cm. It is normal in appearance. There is a possibility of bowel stuck to the vaginal cuff Survey of the adnexa demonstrates no adnexal masses. There is no free fluid in the cul de sac.  Impression: 1.The patient had a total hysterectomy and LT oopherectomy 2. There is a possibility of bowel stuck to the vaginal cuff   Recommendations: 1.Clinical correlation with the patient's History and Physical Exam.  GAD 7 : Generalized Anxiety Score 03/25/2018  Nervous, Anxious, on Edge 3  Control/stop worrying 3  Worry too much - different things 3  Trouble relaxing 3  Restless 3  Easily annoyed or irritable 3  Afraid - awful might happen 3  Total GAD 7 Score 21  Anxiety Difficulty Very difficult    Depression screen PHQ 2/9 03/25/2018  Decreased Interest 3  Down, Depressed, Hopeless 3  PHQ - 2 Score 6  Altered sleeping 3  Tired, decreased energy 3  Change in appetite 3  Feeling bad or failure about yourself  3  Trouble concentrating 3  Moving slowly or fidgety/restless 3  Suicidal thoughts 0  PHQ-9 Score 24  Difficult doing work/chores Very difficult    Assessment:   1. Pelvic pain   2. Hot flashes due to menopause  Plan:   Ultrasound findings reviewed with patient, verbalized understanding.   After consultation with Dr. Logan Bores, will send to MD side to discuss need for lap.   Rx: Paxil, see orders.   Samples of Premarin cream with instructions given.   Reviewed red flag symptoms and when to call.   RTC x 4-6 weeks for follow up or sooner if needed.    Gunnar Bulla, CNM [facility], [facility] 03/25/18 2:33 PM

## 2018-03-27 ENCOUNTER — Other Ambulatory Visit: Payer: Self-pay | Admitting: Certified Nurse Midwife

## 2018-03-27 DIAGNOSIS — Z1231 Encounter for screening mammogram for malignant neoplasm of breast: Secondary | ICD-10-CM

## 2018-04-09 ENCOUNTER — Ambulatory Visit: Payer: 59 | Admitting: Obstetrics and Gynecology

## 2018-04-09 ENCOUNTER — Encounter: Payer: Self-pay | Admitting: Obstetrics and Gynecology

## 2018-04-09 VITALS — BP 137/87 | HR 66 | Ht 62.0 in | Wt 188.9 lb

## 2018-04-09 DIAGNOSIS — R102 Pelvic and perineal pain: Secondary | ICD-10-CM

## 2018-04-09 DIAGNOSIS — N736 Female pelvic peritoneal adhesions (postinfective): Secondary | ICD-10-CM | POA: Diagnosis not present

## 2018-04-09 DIAGNOSIS — N941 Unspecified dyspareunia: Secondary | ICD-10-CM

## 2018-04-09 DIAGNOSIS — Z90710 Acquired absence of both cervix and uterus: Secondary | ICD-10-CM | POA: Diagnosis not present

## 2018-04-09 NOTE — Progress Notes (Signed)
GYNECOLOGY PROGRESS NOTE  Subjective:    Patient ID: Melanie Beltran, female    DOB: [birth year], 53 y.o.   MRN: [id number]  HPI  Patient is a 53 y.o. G56P2002 female who presents for consultation from Serafina Royals, CNM.  Patient has a past surgical history of abdominal hysterectomy with left oophorectomy, now with reported pelvic pain and dyspareunia for the past 3-4 months.  She is also experiencing hot flashes and mood swings. Was prescribed Paxil at last visit.   The following portions of the patient's history were reviewed and updated as appropriate: allergies, current medications, past family history, past medical history, past social history, past surgical history and problem list.  Review of Systems Pertinent items noted in HPI and remainder of comprehensive ROS otherwise negative.   Objective:   Blood pressure 137/87, pulse 66, height 5\' 2"  (1.575 m), weight 188 lb 14.4 oz (85.7 kg). General appearance: alert and no distress Abdomen: soft, non-tender; bowel sounds normal; no masses,  no organomegaly Pelvic: deferred.    Imaging:  US PELVIS TRANSVANGINAL NON-OB (TV ONLY) Patient Name: Melanie Beltran DOB: April 15, 1965 MRN: [id number]  ULTRASOUND REPORT  Location: [facility]  Date of Service: 03/25/2018   Indications:Pelvic Pain Findings:  The patient had a total hysterectomy and LT oopherectomy Right Ovary measures 1.7x1.9x1.3 cm. It is normal in appearance. There is a possibility of bowel stuck to the vaginal cuff Survey of the adnexa demonstrates no adnexal masses. There is no free fluid in the cul de sac.  Impression: 1.The patient had a total hysterectomy and LT oopherectomy 2. There is a possibility of bowel stuck to the vaginal cuff  Recommendations: 1.Clinical correlation with the patient's History and Physical Exam   (Vaginal cuff pain, Pain with IC etc)  Abeer Alsammarraie, RDMS  The ultrasound images and findings were reviewed by me and I  agree with  the above report.  Elonda Husky, M.D. 03/26/2018 4:54 PM   Assessment:   Pelvic pain Dyspareunia S/p hysterectomy Pelvic adhesions  Plan:   Discussion had with patient again regarding ultrasound findings. Discussed option of non-surgical intervention with pelvic floor physical therapy vs surgical intervention with laparoscopic lysis of adhesions.  Patient would like to proceed with surgical intervention. The risks of surgery were discussed in detail with the patient including but not limited to: bleeding which may require transfusion or reoperation; infection which may require prolonged hospitalization or re-hospitalization and antibiotic therapy; injury to bowel, bladder, ureters and major vessels or other surrounding organs; need for additional procedures including laparotomy; thromboembolic phenomenon, incisional problems and other postoperative or anesthesia complications.  Patient was told that the likelihood that her condition and symptoms will be treated effectively with this surgical management was very high; the postoperative expectations were also discussed in detail. The patient also understands the alternative treatment options which were discussed in full. All questions were answered.  She was told that she will be contacted by our surgical scheduler regarding the time and date of her surgery; routine preoperative instructions of having nothing to eat or drink after midnight on the day prior to surgery and also coming to the hospital 1.5 hours prior to her time of surgery were also emphasized.  She was told she may be called for a preoperative appointment about a week prior to surgery and will be given further preoperative instructions at that visit. Printed patient education handouts about the procedure were given to the patient to review at home.  Surgery scheduled  for 04/14/2018.     A total of 15 minutes were spent face-to-face with the patient during this encounter  and over half of that time dealt with counseling and coordination of care.   Hildred Laser, MD [facility]

## 2018-04-09 NOTE — Patient Instructions (Signed)
Diagnostic Laparoscopy Diagnostic laparoscopy is a procedure to diagnose diseases in the abdomen. It might be done for a variety of reasons, such as to look for scar tissue, cancer, or a reason for abdomen (abdominal) pain. During the procedure, a thin, flexible tube that has a light and a camera on the end (laparoscope) is inserted through an incision in the abdomen. The image from the camera is shown on a monitor to help your surgeon see inside your body. Tell a health care provider about:  Any allergies you have.  All medicines you are taking, including vitamins, herbs, eye drops, creams, and over-the-counter medicines.  Any problems you or family members have had with anesthetic medicines.  Any blood disorders you have.  Any surgeries you have had.  Any medical conditions you have. What are the risks? Generally, this is a safe procedure. However, problems may occur, including:  Infection.  Bleeding.  Allergic reactions to medicines or dyes.  Damage to abdominal structures or organs, such as the intestines, liver, stomach, or spleen. What happens before the procedure? Medicines  Ask your health care provider about: ? Changing or stopping your regular medicines. This is especially important if you are taking diabetes medicines or blood thinners. ? Taking medicines such as aspirin and ibuprofen. These medicines can thin your blood. Do not take these medicines unless your health care provider tells you to take them. ? Taking over-the-counter medicines, vitamins, herbs, and supplements.  You may be given antibiotic medicine to help prevent infection. Staying hydrated Follow instructions from your health care provider about hydration, which may include:  Up to 2 hours before the procedure - you may continue to drink clear liquids, such as water, clear fruit juice, black coffee, and plain tea. Eating and drinking restrictions Follow instructions from your health care provider  about eating and drinking, which may include:  8 hours before the procedure - stop eating heavy meals or foods such as meat, fried foods, or fatty foods.  6 hours before the procedure - stop eating light meals or foods, such as toast or cereal.  6 hours before the procedure - stop drinking milk or drinks that contain milk.  2 hours before the procedure - stop drinking clear liquids. General instructions  Ask your health care provider how your surgical site will be marked or identified.  You may be asked to shower with a germ-killing soap.  Plan to have someone take you home from the hospital or clinic.  Plan to have a responsible adult care for you for at least 24 hours after you leave the hospital or clinic. This is important. What happens during the procedure?   To lower your risk of infection: ? Your health care team will wash or sanitize their hands. ? Hair may be removed from the surgical area. ? Your skin will be washed with soap.  An IV will be inserted into one of your veins.  You will be given a medicine to make you fall asleep (general anesthetic). You may also be given a medicine to help you relax (sedative).  A breathing tube will be placed down your throat to help you breathe during the procedure.  Your abdomen will be filled with an air-like gas so it expands. This will give the surgeon more room to operate and will make your organs easier to see.  Many small incisions will be made in your abdomen.  A laparoscope and other surgical instruments will be inserted into your abdomen through the   incisions.  A tissue sample may be removed from an organ for examination (biopsy). This will depend on the reason why you are having this procedure.  The laparoscope and other instruments will be removed from your abdomen.  The gas will be released.  Your incisions will be closed with stitches (sutures) and covered with a bandage (dressing).  Your breathing tube will be  removed. The procedure may vary among health care providers and hospitals. What happens after the procedure?   Your blood pressure, heart rate, breathing rate, and blood oxygen level will be monitored until the medicines you were given have worn off.  Do not drive for 24 hours if you were given a sedative during your procedure.  It is up to you to get the results of your procedure. Ask your health care provider, or the department that is doing the procedure, when your results will be ready. Summary  Diagnostic laparoscopy is a way to look for problems in the abdomen using small incisions.  Follow instructions from your health care provider about how to prepare for the procedure.  Plan to have a responsible adult care for you for at least 24 hours after you leave the hospital or clinic. This is important. This information is not intended to replace advice given to you by your health care provider. Make sure you discuss any questions you have with your health care provider. Document Released: 05/07/2000 Document Revised: 12/27/2016 Document Reviewed: 07/25/2016 Elsevier Interactive Patient Education  2019 [employer]  

## 2018-04-09 NOTE — Progress Notes (Signed)
Pt is present today for follow up for pelvic pain and to discuss surgery for her issues.

## 2018-04-10 ENCOUNTER — Encounter: Payer: Self-pay | Admitting: Obstetrics and Gynecology

## 2018-04-11 ENCOUNTER — Ambulatory Visit
Admission: RE | Admit: 2018-04-11 | Discharge: 2018-04-11 | Disposition: A | Discharge status: Home / Self Care | Payer: 59 | Source: Ambulatory Visit | Attending: Certified Nurse Midwife | Admitting: Certified Nurse Midwife

## 2018-04-11 DIAGNOSIS — Z1231 Encounter for screening mammogram for malignant neoplasm of breast: Secondary | ICD-10-CM | POA: Insufficient documentation

## 2018-04-11 NOTE — H&P (View-Only) (Signed)
  GYNECOLOGY PREOPERATIVE HISTORY AND PHYSICAL   Subjective:  Melanie Beltran is a 53 y.o. G2P2002 here for surgical management of pelvic pain and dyspareunia. Also with suspected pelvic adhesions. Has prior history of hysterectomy with left oophorectomy.  No significant preoperative concerns.  Proposed surgery: Laparoscopic lysis of adhesions.    Pertinent Gynecological History: Menses: None. Patient is s/p hysterectomy Contraception: status post hysterectomy Last pap: normal Date: 2018   Past Medical History:  Diagnosis Date  . Fibromyalgia   . Hypertension   . Hypothyroidism    Past Surgical History:  Procedure Laterality Date  . ABDOMINAL HYSTERECTOMY  1995  . LEFT OOPHORECTOMY    . NASAL SINUS SURGERY      OB History  Gravida Para Term Preterm AB Living  2 2 2 0 0 2  SAB TAB Ectopic Multiple Live Births  0 0 0 0 2    # Outcome Date GA Lbr Len/2nd Weight Sex Delivery Anes PTL Lv  2 Term 03/27/91   7 lb 4 oz (3.289 kg) F Vag-Spont   LIV  1 Term 04/08/89   7 lb 2 oz (3.232 kg) F Vag-Spont   LIV    Family History  Problem Relation Age of Onset  . Cancer Mother   . Non-Hodgkin's lymphoma Father   . Breast cancer Neg Hx   . Ovarian cancer Neg Hx   . Colon cancer Neg Hx     Social History   Socioeconomic History  . Marital status: Married    Spouse name: Not on file  . Number of children: Not on file  . Years of education: Not on file  . Highest education level: Not on file  Occupational History  . Not on file  Social Needs  . Financial resource strain: Not on file  . Food insecurity:    Worry: Not on file    Inability: Not on file  . Transportation needs:    Medical: Not on file    Non-medical: Not on file  Tobacco Use  . Smoking status: Never Smoker  . Smokeless tobacco: Never Used  Substance and Sexual Activity  . Alcohol use: Yes    Comment: occasional    . Drug use: Never  . Sexual activity: Yes    Birth control/protection: Surgical   Lifestyle  . Physical activity:    Days per week: Not on file    Minutes per session: Not on file  . Stress: Not on file  Relationships  . Social connections:    Talks on phone: Not on file    Gets together: Not on file    Attends religious service: Not on file    Active member of club or organization: Not on file    Attends meetings of clubs or organizations: Not on file    Relationship status: Not on file  . Intimate partner violence:    Fear of current or ex partner: Not on file    Emotionally abused: Not on file    Physically abused: Not on file    Forced sexual activity: Not on file  Other Topics Concern  . Not on file  Social History Narrative  . Not on file    Current Outpatient Medications on File Prior to Visit  Medication Sig Dispense Refill  . DEXILANT 60 MG capsule Take 60 mg by mouth daily.    . levothyroxine (SYNTHROID, LEVOTHROID) 50 MCG tablet Take 50 mcg by mouth daily.    . metoprolol succinate (  TOPROL-XL) 25 MG 24 hr tablet Take 25 mg by mouth at bedtime.     . PARoxetine (PAXIL) 20 MG tablet Take 1 tablet (20 mg total) by mouth daily. (Patient taking differently: Take 20 mg by mouth at bedtime. ) 30 tablet 1   No current facility-administered medications on file prior to visit.     Allergies  Allergen Reactions  . Mesalamine Anaphylaxis    Throat closes  . Shellfish Allergy Anaphylaxis and Nausea And Vomiting    Throat closes      Review of Systems Constitutional: No recent fever/chills/sweats Respiratory: No recent cough/bronchitis Cardiovascular: No chest pain Gastrointestinal: No recent nausea/vomiting/diarrhea Genitourinary: No UTI symptoms Hematologic/lymphatic:No history of coagulopathy or recent blood thinner use    Objective:   Blood pressure 137/87, pulse 66, height 5' 2" (1.575 m), weight 188 lb 14.4 oz (85.7 kg). CONSTITUTIONAL: Well-developed, well-nourished female in no acute distress.  HENT:  Normocephalic, atraumatic,  External right and left ear normal. Oropharynx is clear and moist EYES: Conjunctivae and EOM are normal. Pupils are equal, round, and reactive to light. No scleral icterus.  NECK: Normal range of motion, supple, no masses SKIN: Skin is warm and dry. No rash noted. Not diaphoretic. No erythema. No pallor. NEUROLOGIC: Alert and oriented to person, place, and time. Normal reflexes, muscle tone coordination. No cranial nerve deficit noted. PSYCHIATRIC: Normal mood and affect. Normal behavior. Normal judgment and thought content. CARDIOVASCULAR: Normal heart rate noted, regular rhythm RESPIRATORY: Effort and breath sounds normal, no problems with respiration noted ABDOMEN: Soft, nontender, nondistended. PELVIC: Deferred MUSCULOSKELETAL: Normal range of motion. No edema and no tenderness. 2+ distal pulses.    Labs: No results found for this or any previous visit (from the past 336 hour(s)).   Imaging Studies: Us Pelvis Transvanginal Non-ob (tv Only)  Result Date: 03/26/2018 Patient Name: Melanie Beltran DOB: 12/26/1965 MRN: [id number] ULTRASOUND REPORT Location: [facility] Date of Service: 03/25/2018 Indications:Pelvic Pain Findings: The patient had a total hysterectomy and LT oopherectomy Right Ovary measures 1.7x1.9x1.3 cm. It is normal in appearance. There is a possibility of bowel stuck to the vaginal cuff Survey of the adnexa demonstrates no adnexal masses. There is no free fluid in the cul de sac. Impression: 1.The patient had a total hysterectomy and LT oopherectomy 2. There is a possibility of bowel stuck to the vaginal cuff Recommendations: 1.Clinical correlation with the patient's History and Physical Exam  (Vaginal cuff pain, Pain with IC etc) Abeer Alsammarraie, RDMS The ultrasound images and findings were reviewed by me and I agree with the above report. David J. Evans, M.D. 03/26/2018 4:54 PM    Assessment:    Pelvic pain in female Pelvic adhesions Dyspareunia S/p  hysterectomy  Plan:    Counseling: Procedure, risks, reasons, benefits and complications (including injury to bowel, bladder, major blood vessel, ureter, bleeding, possibility of transfusion, infection, or fistula formation) reviewed in detail. Likelihood of success in alleviating the patient's condition was discussed. Routine postoperative instructions will be reviewed with the patient and her family in detail after surgery.  The patient concurred with the proposed plan, giving informed written consent for the surgery.   Preop testing ordered. Instructions reviewed, including NPO after midnight.      Coby Antrobus, MD [facility]   

## 2018-04-11 NOTE — H&P (Addendum)
GYNECOLOGY PREOPERATIVE HISTORY AND PHYSICAL   Subjective:  Melanie Beltran is a 53 y.o. [id number] here for surgical management of pelvic pain and dyspareunia. Also with suspected pelvic adhesions. Has prior history of hysterectomy with left oophorectomy.  No significant preoperative concerns.  Proposed surgery: Laparoscopic lysis of adhesions.    Pertinent Gynecological History: Menses: None. Patient is s/p hysterectomy Contraception: status post hysterectomy Last pap: normal Date: 2018   Past Medical History:  Diagnosis Date  . Fibromyalgia   . Hypertension   . Hypothyroidism    Past Surgical History:  Procedure Laterality Date  . ABDOMINAL HYSTERECTOMY  1995  . LEFT OOPHORECTOMY    . NASAL SINUS SURGERY      OB History  Gravida Para Term Preterm AB Living  2 2 2  0 0 2  SAB TAB Ectopic Multiple Live Births  0 0 0 0 2    # Outcome Date GA Lbr Len/2nd Weight Sex Delivery Anes PTL Lv  2 Term 03/27/91   7 lb 4 oz (3.289 kg) F Vag-Spont   LIV  1 Term 04/08/89   7 lb 2 oz (3.232 kg) F Vag-Spont   LIV    Family History  Problem Relation Age of Onset  . Cancer Mother   . Non-Hodgkin's lymphoma Father   . Breast cancer Neg Hx   . Ovarian cancer Neg Hx   . Colon cancer Neg Hx     Social History   Socioeconomic History  . Marital status: Married    Spouse name: Not on file  . Number of children: Not on file  . Years of education: Not on file  . Highest education level: Not on file  Occupational History  . Not on file  Social Needs  . Financial resource strain: Not on file  . Food insecurity:    Worry: Not on file    Inability: Not on file  . Transportation needs:    Medical: Not on file    Non-medical: Not on file  Tobacco Use  . Smoking status: Never Smoker  . Smokeless tobacco: Never Used  Substance and Sexual Activity  . Alcohol use: Yes    Comment: occasional    . Drug use: Never  . Sexual activity: Yes    Birth control/protection: Surgical   Lifestyle  . Physical activity:    Days per week: Not on file    Minutes per session: Not on file  . Stress: Not on file  Relationships  . Social connections:    Talks on phone: Not on file    Gets together: Not on file    Attends religious service: Not on file    Active member of club or organization: Not on file    Attends meetings of clubs or organizations: Not on file    Relationship status: Not on file  . Intimate partner violence:    Fear of current or ex partner: Not on file    Emotionally abused: Not on file    Physically abused: Not on file    Forced sexual activity: Not on file  Other Topics Concern  . Not on file  Social History Narrative  . Not on file    Current Outpatient Medications on File Prior to Visit  Medication Sig Dispense Refill  . DEXILANT 60 MG capsule Take 60 mg by mouth daily.    Marland Kitchen levothyroxine (SYNTHROID, LEVOTHROID) 50 MCG tablet Take 50 mcg by mouth daily.    . metoprolol succinate (  TOPROL-XL) 25 MG 24 hr tablet Take 25 mg by mouth at bedtime.     Marland Kitchen PARoxetine (PAXIL) 20 MG tablet Take 1 tablet (20 mg total) by mouth daily. (Patient taking differently: Take 20 mg by mouth at bedtime. ) 30 tablet 1   No current facility-administered medications on file prior to visit.     Allergies  Allergen Reactions  . Mesalamine Anaphylaxis    Throat closes  . Shellfish Allergy Anaphylaxis and Nausea And Vomiting    Throat closes      Review of Systems Constitutional: No recent fever/chills/sweats Respiratory: No recent cough/bronchitis Cardiovascular: No chest pain Gastrointestinal: No recent nausea/vomiting/diarrhea Genitourinary: No UTI symptoms Hematologic/lymphatic:No history of coagulopathy or recent blood thinner use    Objective:   Blood pressure 137/87, pulse 66, height 5\' 2"  (1.575 m), weight 188 lb 14.4 oz (85.7 kg). CONSTITUTIONAL: Well-developed, well-nourished female in no acute distress.  HENT:  Normocephalic, atraumatic,  External right and left ear normal. Oropharynx is clear and moist EYES: Conjunctivae and EOM are normal. Pupils are equal, round, and reactive to light. No scleral icterus.  NECK: Normal range of motion, supple, no masses SKIN: Skin is warm and dry. No rash noted. Not diaphoretic. No erythema. No pallor. NEUROLOGIC: Alert and oriented to person, place, and time. Normal reflexes, muscle tone coordination. No cranial nerve deficit noted. PSYCHIATRIC: Normal mood and affect. Normal behavior. Normal judgment and thought content. CARDIOVASCULAR: Normal heart rate noted, regular rhythm RESPIRATORY: Effort and breath sounds normal, no problems with respiration noted ABDOMEN: Soft, nontender, nondistended. PELVIC: Deferred MUSCULOSKELETAL: Normal range of motion. No edema and no tenderness. 2+ distal pulses.    Labs: No results found for this or any previous visit (from the past 336 hour(s)).   Imaging Studies: US Pelvis Transvanginal Non-ob (tv Only)  Result Date: 03/26/2018 Patient Name: Melanie Beltran DOB: 1965-09-11 MRN: [id number] ULTRASOUND REPORT Location: [facility] Date of Service: 03/25/2018 Indications:Pelvic Pain Findings: The patient had a total hysterectomy and LT oopherectomy Right Ovary measures 1.7x1.9x1.3 cm. It is normal in appearance. There is a possibility of bowel stuck to the vaginal cuff Survey of the adnexa demonstrates no adnexal masses. There is no free fluid in the cul de sac. Impression: 1.The patient had a total hysterectomy and LT oopherectomy 2. There is a possibility of bowel stuck to the vaginal cuff Recommendations: 1.Clinical correlation with the patient's History and Physical Exam  (Vaginal cuff pain, Pain with IC etc) Abeer Alsammarraie, RDMS The ultrasound images and findings were reviewed by me and I agree with the above report. Elonda Husky, M.D. 03/26/2018 4:54 PM    Assessment:    Pelvic pain in female Pelvic adhesions Dyspareunia S/p  hysterectomy  Plan:    Counseling: Procedure, risks, reasons, benefits and complications (including injury to bowel, bladder, major blood vessel, ureter, bleeding, possibility of transfusion, infection, or fistula formation) reviewed in detail. Likelihood of success in alleviating the patient's condition was discussed. Routine postoperative instructions will be reviewed with the patient and her family in detail after surgery.  The patient concurred with the proposed plan, giving informed written consent for the surgery.   Preop testing ordered. Instructions reviewed, including NPO after midnight.      Hildred Laser, MD [facility]

## 2018-04-13 ENCOUNTER — Encounter: Payer: Self-pay | Admitting: Anesthesiology

## 2018-04-14 ENCOUNTER — Encounter: Payer: Self-pay | Admitting: *Deleted

## 2018-04-14 ENCOUNTER — Encounter
Admission: RE | Disposition: A | Discharge status: Home / Self Care | Payer: Self-pay | Source: Home / Self Care | Attending: Obstetrics and Gynecology

## 2018-04-14 ENCOUNTER — Ambulatory Visit
Admission: RE | Admit: 2018-04-14 | Discharge: 2018-04-14 | Disposition: A | Discharge status: Home / Self Care | Payer: 59 | Attending: Obstetrics and Gynecology | Admitting: Obstetrics and Gynecology

## 2018-04-14 ENCOUNTER — Other Ambulatory Visit: Payer: Self-pay | Admitting: Certified Nurse Midwife

## 2018-04-14 ENCOUNTER — Ambulatory Visit: Payer: 59 | Admitting: Anesthesiology

## 2018-04-14 ENCOUNTER — Other Ambulatory Visit: Payer: Self-pay

## 2018-04-14 DIAGNOSIS — N736 Female pelvic peritoneal adhesions (postinfective): Secondary | ICD-10-CM | POA: Insufficient documentation

## 2018-04-14 DIAGNOSIS — N941 Unspecified dyspareunia: Secondary | ICD-10-CM | POA: Insufficient documentation

## 2018-04-14 DIAGNOSIS — Z79890 Hormone replacement therapy (postmenopausal): Secondary | ICD-10-CM | POA: Diagnosis not present

## 2018-04-14 DIAGNOSIS — I10 Essential (primary) hypertension: Secondary | ICD-10-CM | POA: Insufficient documentation

## 2018-04-14 DIAGNOSIS — Z8742 Personal history of other diseases of the female genital tract: Secondary | ICD-10-CM

## 2018-04-14 DIAGNOSIS — R102 Pelvic and perineal pain: Secondary | ICD-10-CM

## 2018-04-14 DIAGNOSIS — Z87892 Personal history of anaphylaxis: Secondary | ICD-10-CM | POA: Diagnosis not present

## 2018-04-14 DIAGNOSIS — Z90710 Acquired absence of both cervix and uterus: Secondary | ICD-10-CM | POA: Insufficient documentation

## 2018-04-14 DIAGNOSIS — R928 Other abnormal and inconclusive findings on diagnostic imaging of breast: Secondary | ICD-10-CM

## 2018-04-14 DIAGNOSIS — M797 Fibromyalgia: Secondary | ICD-10-CM | POA: Insufficient documentation

## 2018-04-14 DIAGNOSIS — E039 Hypothyroidism, unspecified: Secondary | ICD-10-CM | POA: Insufficient documentation

## 2018-04-14 DIAGNOSIS — Z79899 Other long term (current) drug therapy: Secondary | ICD-10-CM | POA: Insufficient documentation

## 2018-04-14 DIAGNOSIS — Z91013 Allergy to seafood: Secondary | ICD-10-CM | POA: Insufficient documentation

## 2018-04-14 DIAGNOSIS — Z888 Allergy status to other drugs, medicaments and biological substances status: Secondary | ICD-10-CM | POA: Insufficient documentation

## 2018-04-14 DIAGNOSIS — N6310 Unspecified lump in the right breast, unspecified quadrant: Secondary | ICD-10-CM

## 2018-04-14 DIAGNOSIS — Z9889 Other specified postprocedural states: Secondary | ICD-10-CM

## 2018-04-14 HISTORY — PX: LAPAROSCOPY: SHX197

## 2018-04-14 HISTORY — DX: Other specified postprocedural states: Z98.890

## 2018-04-14 HISTORY — DX: Other specified postprocedural states: R11.2

## 2018-04-14 LAB — CBC
HCT: 44.3 % (ref 36.0–46.0)
Hemoglobin: 15 g/dL (ref 12.0–15.0)
MCH: 30.2 pg (ref 26.0–34.0)
MCHC: 33.9 g/dL (ref 30.0–36.0)
MCV: 89.1 fL (ref 80.0–100.0)
NRBC: 0 % (ref 0.0–0.2)
Platelets: 193 10*3/uL (ref 150–400)
RBC: 4.97 MIL/uL (ref 3.87–5.11)
RDW: 12.2 % (ref 11.5–15.5)
WBC: 3.5 10*3/uL — ABNORMAL LOW (ref 4.0–10.5)

## 2018-04-14 SURGERY — LAPAROSCOPY OPERATIVE
Anesthesia: General

## 2018-04-14 MED ORDER — LIDOCAINE HCL (PF) 2 % IJ SOLN
INTRAMUSCULAR | Status: AC
  Filled 2018-04-14: qty 10

## 2018-04-14 MED ORDER — BUPIVACAINE HCL 0.5 % IJ SOLN
INTRAMUSCULAR | Status: DC | PRN
  Administered 2018-04-14: 10 mL

## 2018-04-14 MED ORDER — FENTANYL CITRATE (PF) 100 MCG/2ML IJ SOLN
25.0000 ug | INTRAMUSCULAR | Status: DC | PRN
  Administered 2018-04-14 (×3): 25 ug via INTRAVENOUS

## 2018-04-14 MED ORDER — PROPOFOL 10 MG/ML IV BOLUS
INTRAVENOUS | Status: DC | PRN
  Administered 2018-04-14: 150 mg via INTRAVENOUS

## 2018-04-14 MED ORDER — IBUPROFEN 800 MG PO TABS: 800.0000 mg | ORAL_TABLET | Freq: Three times a day (TID) | ORAL | 1 refills | Status: DC | PRN

## 2018-04-14 MED ORDER — HYDROCODONE-ACETAMINOPHEN 5-325 MG PO TABS
1.0000 | ORAL_TABLET | Freq: Once | ORAL | Status: AC
  Administered 2018-04-14: 1 via ORAL

## 2018-04-14 MED ORDER — LACTATED RINGERS IV SOLN
INTRAVENOUS | Status: DC
  Administered 2018-04-14 (×2): via INTRAVENOUS

## 2018-04-14 MED ORDER — PROPOFOL 10 MG/ML IV BOLUS
INTRAVENOUS | Status: AC
  Filled 2018-04-14: qty 20

## 2018-04-14 MED ORDER — MIDAZOLAM HCL 2 MG/2ML IJ SOLN
INTRAMUSCULAR | Status: AC
  Filled 2018-04-14: qty 2

## 2018-04-14 MED ORDER — SCOPOLAMINE 1 MG/3DAYS TD PT72
1.0000 | MEDICATED_PATCH | TRANSDERMAL | Status: DC
  Administered 2018-04-14: 1.5 mg via TRANSDERMAL

## 2018-04-14 MED ORDER — HYDROCODONE-ACETAMINOPHEN 5-325 MG PO TABS
ORAL_TABLET | ORAL | Status: AC
  Filled 2018-04-14: qty 1

## 2018-04-14 MED ORDER — ACETAMINOPHEN 500 MG PO TABS
1000.0000 mg | ORAL_TABLET | ORAL | Status: AC
  Administered 2018-04-14: 1000 mg via ORAL

## 2018-04-14 MED ORDER — LACTATED RINGERS IV SOLN: INTRAVENOUS | Status: DC

## 2018-04-14 MED ORDER — SCOPOLAMINE 1 MG/3DAYS TD PT72
MEDICATED_PATCH | TRANSDERMAL | Status: AC
  Filled 2018-04-14: qty 1

## 2018-04-14 MED ORDER — FENTANYL CITRATE (PF) 100 MCG/2ML IJ SOLN
INTRAMUSCULAR | Status: AC
  Administered 2018-04-14: 25 ug via INTRAVENOUS
  Filled 2018-04-14: qty 2

## 2018-04-14 MED ORDER — FENTANYL CITRATE (PF) 100 MCG/2ML IJ SOLN
INTRAMUSCULAR | Status: DC | PRN
  Administered 2018-04-14: 50 ug via INTRAVENOUS

## 2018-04-14 MED ORDER — BUPIVACAINE HCL (PF) 0.5 % IJ SOLN
INTRAMUSCULAR | Status: AC
  Filled 2018-04-14: qty 30

## 2018-04-14 MED ORDER — MIDAZOLAM HCL 2 MG/2ML IJ SOLN
INTRAMUSCULAR | Status: DC | PRN
  Administered 2018-04-14 (×2): 1 mg via INTRAVENOUS

## 2018-04-14 MED ORDER — GABAPENTIN 300 MG PO CAPS
ORAL_CAPSULE | ORAL | Status: AC
  Administered 2018-04-14: 300 mg via ORAL
  Filled 2018-04-14: qty 1

## 2018-04-14 MED ORDER — ONDANSETRON HCL 4 MG/2ML IJ SOLN
INTRAMUSCULAR | Status: DC | PRN
  Administered 2018-04-14: 4 mg via INTRAVENOUS

## 2018-04-14 MED ORDER — CIPROFLOXACIN-DEXAMETHASONE 0.3-0.1 % OT SUSP
OTIC | Status: AC
  Filled 2018-04-14: qty 7.5

## 2018-04-14 MED ORDER — ONDANSETRON HCL 4 MG/2ML IJ SOLN
INTRAMUSCULAR | Status: AC
  Administered 2018-04-14: 4 mg via INTRAVENOUS
  Filled 2018-04-14: qty 2

## 2018-04-14 MED ORDER — SEVOFLURANE IN SOLN
RESPIRATORY_TRACT | Status: AC
  Filled 2018-04-14: qty 250

## 2018-04-14 MED ORDER — FENTANYL CITRATE (PF) 100 MCG/2ML IJ SOLN
INTRAMUSCULAR | Status: AC
  Filled 2018-04-14: qty 2

## 2018-04-14 MED ORDER — GABAPENTIN 300 MG PO CAPS
300.0000 mg | ORAL_CAPSULE | ORAL | Status: AC
  Administered 2018-04-14: 300 mg via ORAL

## 2018-04-14 MED ORDER — HYDROCODONE-ACETAMINOPHEN 5-325 MG PO TABS: 1.0000 | ORAL_TABLET | Freq: Four times a day (QID) | ORAL | 0 refills | Status: DC | PRN

## 2018-04-14 MED ORDER — ACETAMINOPHEN 500 MG PO TABS
ORAL_TABLET | ORAL | Status: AC
  Administered 2018-04-14: 1000 mg via ORAL
  Filled 2018-04-14: qty 2

## 2018-04-14 MED ORDER — DEXAMETHASONE SODIUM PHOSPHATE 10 MG/ML IJ SOLN
INTRAMUSCULAR | Status: DC | PRN
  Administered 2018-04-14: 8 mg via INTRAVENOUS

## 2018-04-14 MED ORDER — LIDOCAINE HCL (CARDIAC) PF 100 MG/5ML IV SOSY
PREFILLED_SYRINGE | INTRAVENOUS | Status: DC | PRN
  Administered 2018-04-14 (×3): 40 mg via INTRAVENOUS

## 2018-04-14 MED ORDER — ROCURONIUM BROMIDE 100 MG/10ML IV SOLN
INTRAVENOUS | Status: DC | PRN
  Administered 2018-04-14: 10 mg via INTRAVENOUS
  Administered 2018-04-14: 40 mg via INTRAVENOUS

## 2018-04-14 MED ORDER — ONDANSETRON HCL 4 MG/2ML IJ SOLN
4.0000 mg | Freq: Once | INTRAMUSCULAR | Status: AC
  Administered 2018-04-14: 4 mg via INTRAVENOUS

## 2018-04-14 MED ORDER — ONDANSETRON HCL 4 MG/2ML IJ SOLN
4.0000 mg | Freq: Once | INTRAMUSCULAR | Status: AC | PRN
  Administered 2018-04-14: 4 mg via INTRAVENOUS

## 2018-04-14 SURGICAL SUPPLY — 37 items
ADH SKN CLS APL DERMABOND .7 (GAUZE/BANDAGES/DRESSINGS) ×1
BLADE SURG SZ11 CARB STEEL (BLADE) ×2 IMPLANT
CANISTER SUCT 1200ML W/VALVE (MISCELLANEOUS) ×2 IMPLANT
CATH ROBINSON RED A/P 16FR (CATHETERS) ×2 IMPLANT
CHLORAPREP W/TINT 26ML (MISCELLANEOUS) ×2 IMPLANT
CORD MONOPOLAR M/FML 12FT (MISCELLANEOUS) IMPLANT
COVER WAND RF STERILE (DRAPES) ×2 IMPLANT
DERMABOND ADVANCED (GAUZE/BANDAGES/DRESSINGS) ×1
DERMABOND ADVANCED .7 DNX12 (GAUZE/BANDAGES/DRESSINGS) ×1 IMPLANT
GLOVE BIO SURGEON STRL SZ 6.5 (GLOVE) ×2 IMPLANT
GLOVE BIO SURGEON STRL SZ8 (GLOVE) ×2 IMPLANT
GLOVE INDICATOR 7.0 STRL GRN (GLOVE) ×2 IMPLANT
GLOVE INDICATOR 8.0 STRL GRN (GLOVE) ×2 IMPLANT
GOWN STRL REUS W/ TWL LRG LVL3 (GOWN DISPOSABLE) ×2 IMPLANT
GOWN STRL REUS W/TWL LRG LVL3 (GOWN DISPOSABLE) ×4
GOWN STRL REUS W/TWL XL LVL4 (GOWN DISPOSABLE) ×2 IMPLANT
IRRIGATION STRYKERFLOW (MISCELLANEOUS) ×1 IMPLANT
IRRIGATOR STRYKERFLOW (MISCELLANEOUS) ×2
IV LACTATED RINGERS 1000ML (IV SOLUTION) ×2 IMPLANT
KIT PINK PAD W/HEAD ARE REST (MISCELLANEOUS) ×2
KIT PINK PAD W/HEAD ARM REST (MISCELLANEOUS) ×1 IMPLANT
KIT TURNOVER CYSTO (KITS) ×2 IMPLANT
NS IRRIG 500ML POUR BTL (IV SOLUTION) ×2 IMPLANT
PACK GYN LAPAROSCOPIC (MISCELLANEOUS) ×2 IMPLANT
PAD OB MATERNITY 4.3X12.25 (PERSONAL CARE ITEMS) ×2 IMPLANT
PAD PREP 24X41 OB/GYN DISP (PERSONAL CARE ITEMS) ×2 IMPLANT
POUCH ENDO CATCH 10MM SPEC (MISCELLANEOUS) IMPLANT
SCISSORS METZENBAUM CVD 33 (INSTRUMENTS) IMPLANT
SET TUBE SMOKE EVAC HIGH FLOW (TUBING) ×2 IMPLANT
SHEARS HARMONIC ACE PLUS 36CM (ENDOMECHANICALS) IMPLANT
SLEEVE ENDOPATH XCEL 5M (ENDOMECHANICALS) ×2 IMPLANT
SUT VIC AB 3-0 SH 27 (SUTURE)
SUT VIC AB 3-0 SH 27X BRD (SUTURE) IMPLANT
SUT VICRYL 0 AB UR-6 (SUTURE) ×2 IMPLANT
TROCAR ENDO BLADELESS 11MM (ENDOMECHANICALS) ×2 IMPLANT
TROCAR XCEL NON-BLD 5MMX100MML (ENDOMECHANICALS) ×2 IMPLANT
TROCAR XCEL UNIV SLVE 11M 100M (ENDOMECHANICALS) ×1 IMPLANT

## 2018-04-14 NOTE — Transfer of Care (Signed)
Immediate Anesthesia Transfer of Care Note  Patient: Melanie Beltran  Procedure(s) Performed: LAPAROSCOPIC LYSIS OF ADHESIONS (N/A )  Patient Location: PACU  Anesthesia Type:General  Level of Consciousness: awake and sedated  Airway & Oxygen Therapy: Patient Spontanous Breathing and Patient connected to face mask oxygen  Post-op Assessment: Report given to RN and Post -op Vital signs reviewed and stable  Post vital signs: Reviewed and stable  Last Vitals:  Vitals Value Taken Time  BP 145/76 04/14/2018 11:09 AM  Temp    Pulse 71 04/14/2018 11:10 AM  Resp 19 04/14/2018 11:10 AM  SpO2 96 % 04/14/2018 11:10 AM  Vitals shown include unvalidated device data.  Last Pain:  Vitals:   04/14/18 0835  TempSrc: Oral  PainSc: 7          Complications: No apparent anesthesia complications

## 2018-04-14 NOTE — Anesthesia Preprocedure Evaluation (Signed)
Anesthesia Evaluation  Patient identified by MRN, date of birth, ID band Patient awake    Reviewed: Allergy & Precautions, NPO status , Patient's Chart, lab work & pertinent test results, reviewed documented beta blocker date and time   Airway Mallampati: III  TM Distance: >3 FB     Dental  (+) Chipped   Pulmonary           Cardiovascular hypertension, Pt. on medications and Pt. on home beta blockers      Neuro/Psych  Neuromuscular disease    GI/Hepatic   Endo/Other  Hypothyroidism   Renal/GU      Musculoskeletal  (+) Fibromyalgia -  Abdominal   Peds  Hematology   Anesthesia Other Findings Obese. EKG ok 2 yr ago.  Reproductive/Obstetrics                             Anesthesia Physical Anesthesia Plan  ASA: III  Anesthesia Plan: General   Post-op Pain Management:    Induction: Intravenous  PONV Risk Score and Plan:   Airway Management Planned: Oral ETT  Additional Equipment:   Intra-op Plan:   Post-operative Plan:   Informed Consent: I have reviewed the patients History and Physical, chart, labs and discussed the procedure including the risks, benefits and alternatives for the proposed anesthesia with the patient or authorized representative who has indicated his/her understanding and acceptance.       Plan Discussed with: CRNA  Anesthesia Plan Comments:         Anesthesia Quick Evaluation

## 2018-04-14 NOTE — Discharge Instructions (Signed)
Diagnostic Laparoscopy, Care After °This sheet gives you information about how to care for yourself after your procedure. Your health care provider may also give you more specific instructions. If you have problems or questions, contact your health care provider. °What can I expect after the procedure? °After the procedure, it is common to have: °· Mild discomfort in the abdomen. °· Sore throat. °Women who have laparoscopy with pelvic examination may have mild cramping and fluid coming from the vagina for a few days after the procedure. °Follow these instructions at home: °Medicines °· Take over-the-counter and prescription medicines only as told by your health care provider. °· If you were prescribed an antibiotic medicine, take it as told by your health care provider. Do not stop taking the antibiotic even if you start to feel better. °Driving °· Do not drive for 24 hours if you were given a medicine to help you relax (sedative) during your procedure. °· Do not drive or use heavy machinery while taking prescription pain medicine. °Bathing °· Do not take baths, swim, or use a hot tub until your health care provider approves. You may take showers. °Incision care ° °· Follow instructions from your health care provider about how to take care of your incisions. Make sure you: °? Wash your hands with soap and water before you change your bandage (dressing). If soap and water are not available, use hand sanitizer. °? Change your dressing as told by your health care provider. °? Leave stitches (sutures), skin glue, or adhesive strips in place. These skin closures may need to stay in place for 2 weeks or longer. If adhesive strip edges start to loosen and curl up, you may trim the loose edges. Do not remove adhesive strips completely unless your health care provider tells you to do that. °· Check your incision areas every day for signs of infection. Check for: °? Redness, swelling, or pain. °? Fluid or  blood. °? Warmth. °? Pus or a bad smell. °Activity °· Return to your normal activities as told by your health care provider. Ask your health care provider what activities are safe for you. °· Do not lift anything that is heavier than 10 lb (4.5 kg), or the limit that you are told, until your health care provider says that it is safe. °General instructions °· To prevent or treat constipation while you are taking prescription pain medicine, your health care provider may recommend that you: °? Drink enough fluid to keep your urine pale yellow. °? Take over-the-counter or prescription medicines. °? Eat foods that are high in fiber, such as fresh fruits and vegetables, whole grains, and beans. °? Limit foods that are high in fat and processed sugars, such as fried and sweet foods. °· Do not use any products that contain nicotine or tobacco, such as cigarettes and e-cigarettes. If you need help quitting, ask your health care provider. °· Keep all follow-up visits as told by your health care provider. This is important. °Contact a health care provider if: °· You develop shoulder pain. °· You feel lightheaded or faint. °· You are unable to pass gas or have a bowel movement. °· You feel nauseous or you vomit. °· You develop a rash. °· You have redness, swelling, or pain around any incision. °· You have fluid or blood coming from any incision. °· Any incision feels warm to the touch. °· You have pus or a bad smell coming from any incision. °· You have a fever or chills. °Get help   right away if: °· You have severe pain. °· You have vomiting that does not go away. °· You have heavy bleeding from the vagina. °· Any incision opens. °· You have trouble breathing. °· You have chest pain. °Summary °· After the procedure, it is common to have mild discomfort in the abdomen and a sore throat. °· Check your incision areas every day for signs of infection. °· Return to your normal activities as told by your health care provider. Ask  your health care provider what activities are safe for you. °This information is not intended to replace advice given to you by your health care provider. Make sure you discuss any questions you have with your health care provider. °Document Released: 01/10/2015 Document Revised: 07/25/2016 Document Reviewed: 07/25/2016 °Elsevier Interactive Patient Education © 2019 [employer] ° ° °AMBULATORY SURGERY  °DISCHARGE INSTRUCTIONS ° ° °1) The drugs that you were given will stay in your system until tomorrow so for the next 24 hours you should not: ° °A) Drive an automobile °B) Make any legal decisions °C) Drink any alcoholic beverage ° ° °2) You may resume regular meals tomorrow.  Today it is better to start with liquids and gradually work up to solid foods. ° °You may eat anything you prefer, but it is better to start with liquids, then soup and crackers, and gradually work up to solid foods. ° ° °3) Please notify your doctor immediately if you have any unusual bleeding, trouble breathing, redness and pain at the surgery site, drainage, fever, or pain not relieved by medication. ° ° ° °4) Additional Instructions: ° ° ° ° ° ° ° °Please contact your physician with any problems or Same Day Surgery at [phone], Monday through Friday 6 am to 4 pm, or [place] Main number at [phone]. °

## 2018-04-14 NOTE — Op Note (Signed)
Procedure(s): LAPAROSCOPY OPERATIVE with biopsies Procedure Note  Melanie Beltran female 53 y.o. 04/14/2018  Indications: The patient is a 53 y.o. [id number] female with pelvic pain (left-sided), dyspareunia, history of endometriosis, s/p remote hysterectomy with LSO. Recent ultrasound noting possible adhesions of bowel to vaginal cuff.   Pre-operative Diagnosis: pelvic pain (left-sided), dyspareunia, history of endometriosis, s/p remote hysterectomy with LSO. Suspected adhesions of bowel to vaginal cuff.    Post-operative Diagnosis: Same, with minimal pelvic adhesions noted.   Surgeon: Hildred Laser, MD  Assistants:  Dr. Brennan Bailey. No other capable assistant available in surgery requiring a high level assistant.   Jacqulynn Cadet, Sherrie Sport PA-S  Anesthesia: General endotracheal anesthesia  Procedure Details: The patient was seen in the Holding Room. The risks, benefits, complications, treatment options, and expected outcomes were discussed with the patient.  The patient concurred with the proposed plan, giving informed consent.  The site of surgery properly noted/marked. The patient was taken to the Operating Room, identified as Melanie Beltran and the procedure verified as Procedure(s) (LRB): LAPAROSCOPY OPERATIVE with biopsies (N/A).   She was then placed under general anesthesia without difficulty. She was placed in the dorsal lithotomy position, and was prepped and draped in a sterile manner. After an adequate time-out was performed, a straight catheterization was performed. A moist sponge-stick was placed in the vagina for vaginal cuff manipulation. X, attention was turned to the abdomen where an umbilical incision was made with the scalpel.  The Optiview 5-mm trocar and sleeve were then advanced without difficulty with the laparoscope under direct visualization into the abdomen.  The abdomen was then insufflated with carbon dioxide gas and adequate pneumoperitoneum was obtained. A 5-mm right  lower quadrant port was then placed under direct visualization.  A survey of the patient's pelvis and abdomen revealed the findings as above.  Several areas of white fibrotic tissue with petechiae were noted along the vaginal cuff.  Another area was noted near the right fallopian tube along the pelvic sidewall.  Biopsies of each area were taken. There was a filmy adhesion of the bowel on the left above the pelvic brim to the pelvic sidewall.  A final survey was performed, where good hemostasis was noted at all biopsy sites.  All trocars were removed under direct visualization, and the abdomen which was desufflated.    All skin incisions were closed with 4-0 Vicryl subcuticular stitches. The patient tolerated the procedures well.  All instruments, needles, and sponge counts were correct x 2. The patient was taken to the recovery room awake, extubated and in stable condition.   Findings: The uterus, cervix, and left ovary and fallopian tube were surgically absent.  The right ovary and fallopian tubes overall appeared normal.  Several areas of white fibrotic tissue with petechiae were noted along the vaginal cuff.  Another area was noted near the right fallopian tube along the pelvic sidewall.    Estimated Blood Loss:  minimal      Drains: straight catheterization prior to procedure with  400 ml of clear urine         Total IV Fluids:  800 ml  Specimens: Vaginal cuff biopsy; right fallopian tube adhesion.          Implants: None         Complications:  None; patient tolerated the procedure well.         Disposition: PACU - hemodynamically stable.         Condition: stable   Hildred Laser,  MD [facility]

## 2018-04-14 NOTE — Interval H&P Note (Signed)
History and Physical Interval Note:  04/14/2018 9:29 AM  Ria Comment  has presented today for surgery, with the diagnosis of PELVIC PAIN  The various methods of treatment have been discussed with the patient and family. After consideration of risks, benefits and other options for treatment, the patient has consented to  Procedure(s): LAPAROSCOPIC LYSIS OF ADHESIONS (N/A) as a surgical intervention .  The patient's history has been reviewed, patient examined, no change in status, stable for surgery.  I have reviewed the patient's chart and labs.  Questions were answered to the patient's satisfaction.     Hildred Laser, MD [facility]

## 2018-04-14 NOTE — Anesthesia Post-op Follow-up Note (Signed)
Anesthesia QCDR form completed.        

## 2018-04-14 NOTE — Anesthesia Postprocedure Evaluation (Signed)
Anesthesia Post Note  Patient: Melanie Beltran  Procedure(s) Performed: LAPAROSCOPY OPERATIVE with biopsies (N/A )  Patient location during evaluation: PACU Anesthesia Type: General Level of consciousness: awake and alert Pain management: pain level controlled Vital Signs Assessment: post-procedure vital signs reviewed and stable Respiratory status: spontaneous breathing, nonlabored ventilation, respiratory function stable and patient connected to nasal cannula oxygen Cardiovascular status: blood pressure returned to baseline and stable Postop Assessment: no apparent nausea or vomiting Anesthetic complications: no     Last Vitals:  Vitals:   04/14/18 1214 04/14/18 1312  BP: 117/60 129/68  Pulse: 66 (!) 58  Resp: 16 16  Temp: 36.7 C   SpO2: 100% 97%    Last Pain:  Vitals:   04/14/18 1214  TempSrc: Oral  PainSc: 4                  Retha Bither S

## 2018-04-15 LAB — SURGICAL PATHOLOGY

## 2018-04-18 ENCOUNTER — Ambulatory Visit
Admission: RE | Admit: 2018-04-18 | Discharge: 2018-04-18 | Disposition: A | Discharge status: Home / Self Care | Payer: 59 | Source: Ambulatory Visit | Attending: Certified Nurse Midwife | Admitting: Certified Nurse Midwife

## 2018-04-18 ENCOUNTER — Other Ambulatory Visit: Payer: Self-pay | Admitting: Certified Nurse Midwife

## 2018-04-18 DIAGNOSIS — R928 Other abnormal and inconclusive findings on diagnostic imaging of breast: Secondary | ICD-10-CM | POA: Diagnosis present

## 2018-04-18 DIAGNOSIS — N6310 Unspecified lump in the right breast, unspecified quadrant: Secondary | ICD-10-CM

## 2018-04-18 DIAGNOSIS — N6320 Unspecified lump in the left breast, unspecified quadrant: Secondary | ICD-10-CM

## 2018-04-21 ENCOUNTER — Other Ambulatory Visit: Payer: Self-pay | Admitting: Certified Nurse Midwife

## 2018-04-21 DIAGNOSIS — N6489 Other specified disorders of breast: Secondary | ICD-10-CM

## 2018-04-24 ENCOUNTER — Encounter: Payer: Self-pay | Admitting: Obstetrics and Gynecology

## 2018-04-24 ENCOUNTER — Other Ambulatory Visit: Payer: Self-pay

## 2018-04-24 ENCOUNTER — Ambulatory Visit (INDEPENDENT_AMBULATORY_CARE_PROVIDER_SITE_OTHER): Payer: 59 | Admitting: Obstetrics and Gynecology

## 2018-04-24 VITALS — BP 140/77 | HR 68 | Ht 62.0 in | Wt 192.8 lb

## 2018-04-24 DIAGNOSIS — Z8742 Personal history of other diseases of the female genital tract: Secondary | ICD-10-CM

## 2018-04-24 DIAGNOSIS — Z90710 Acquired absence of both cervix and uterus: Secondary | ICD-10-CM

## 2018-04-24 DIAGNOSIS — Z9889 Other specified postprocedural states: Secondary | ICD-10-CM

## 2018-04-24 DIAGNOSIS — Z4889 Encounter for other specified surgical aftercare: Secondary | ICD-10-CM

## 2018-04-24 DIAGNOSIS — N941 Unspecified dyspareunia: Secondary | ICD-10-CM

## 2018-04-24 DIAGNOSIS — R102 Pelvic and perineal pain: Secondary | ICD-10-CM

## 2018-04-24 MED ORDER — ELAGOLIX SODIUM 200 MG PO TABS: 1.0000 | ORAL_TABLET | Freq: Every day | ORAL | 5 refills | Status: DC

## 2018-04-24 NOTE — Progress Notes (Signed)
Pt present today for post-op visit. Pt stated that she was having pain like cycle cramps but nothing major. Pt stated that she is doing well.

## 2018-04-24 NOTE — Progress Notes (Signed)
    OBSTETRICS/GYNECOLOGY POST-OPERATIVE CLINIC VISIT  Subjective:     Melanie Beltran is a 53 y.o. female who presents to the clinic 10 days status post operative laparoscopy with biopsy for pelvic pain and dyspareunia, s/p hysterectomy. Still retains right ovary and tube. She has a prior history of endometriosis. Eating a regular diet without difficulty. Bowel movements are normal. Pain is controlled without any medications.  The following portions of the patient's history were reviewed and updated as appropriate: allergies, current medications, past family history, past medical history, past social history, past surgical history and problem list.  Review of Systems Pertinent items noted in HPI and remainder of comprehensive ROS otherwise negative.    Objective:    BP 140/77   Pulse 68   Ht 5\' 2"  (1.575 m)   Wt 192 lb 12.8 oz (87.5 kg)   BMI 35.26 kg/m  General:  alert and no distress  Abdomen: soft, bowel sounds active, non-tender  Incision:   healing well, no drainage, no erythema, no hernia, no seroma, no swelling, no dehiscence, incision well approximated    Pathology:   Surgical Pathology  CASE: [phone]  PATIENT: Melanie Beltran  Surgical Pathology Report      SPECIMEN SUBMITTED:  A. Vaginal cuff biopsy  B. Tubal adhesion, right   CLINICAL HISTORY:  None provided   PRE-OPERATIVE DIAGNOSIS:  Pelvic pain   POST-OPERATIVE DIAGNOSIS:  Same as pre op      DIAGNOSIS:  A. VAGINAL CUFF; BIOPSY:  - BENIGN FIBROMUSCULAR TISSUE.  - NEGATIVE FOR ENDOMETRIOSIS.  - NEGATIVE FOR MALIGNANCY.   B. TUBAL ADHESION, RIGHT; BIOPSY:  - MESOTHELIAL LINED FIBROMUSCULAR TISSUE.  - NEGATIVE FOR ENDOMETRIOSIS.  - NEGATIVE FOR MALIGNANCY.   Assessment:    Doing well postoperatively.  S/p laparoscopy H/o endometriosis H/o hysterectomy Pelvic pain Dyspareunia  Plan:   1. Continue any current medications as needed. 2. Wound care discussed. 3. Operative  findings again reviewed (no significant pelvic adhesions present, fibrotic tissue noted on right adnexa and vaginal cuff. Pathology report discussed. 4. Activity restrictions: none 5. Anticipated return to work: now. 6. Discussion had with patient regarding next steps. Due to her history of endometriosis and current symptoms resembling a flare of endometriosis (pelvic pain, dyspareunia), discussed suppressive therapy of remaining ovaries.  Discussed use of progesterone (Aygestin), Orilissa, or Danazol.  Patient notes failure of hormonal suppressive therapies in the past to manage her endometriosis, would like to try [place].  7. Follow up: 4 weeks for f/u with Marcelino Duster to reassess symptoms.     Hildred Laser, MD [facility]

## 2018-05-06 ENCOUNTER — Encounter: Payer: 59 | Admitting: Certified Nurse Midwife

## 2018-05-12 ENCOUNTER — Telehealth: Payer: Self-pay | Admitting: Certified Nurse Midwife

## 2018-05-12 ENCOUNTER — Encounter: Payer: Self-pay | Admitting: Certified Nurse Midwife

## 2018-05-12 MED ORDER — PAROXETINE HCL 30 MG PO TABS: 30.0000 mg | ORAL_TABLET | Freq: Every day | ORAL | 1 refills | Status: DC

## 2018-05-12 MED ORDER — ALPRAZOLAM 0.5 MG PO TABS: 0.5000 mg | ORAL_TABLET | Freq: Every evening | ORAL | 0 refills | Status: DC | PRN

## 2018-05-12 NOTE — Telephone Encounter (Signed)
Telephone call from patient. Verified full name and date of birth. Melanie Beltran for pelvic pain. Doing well. Wished to cancel follow up appointment on 05/27/2018, will send message to front office.   Taking Paxil daily. Reports relief of hot flashes, but desires to increase dose. Patient's mother died last week after stay in hospice. Condolences offered.   Will increased Paxil to 30 mg daily, see orders. Will add Xanax due to increased anxiety and difficulty sleeping since mother's passing, see orders.   Medication education sheets sent via MyChart.   Reviewed red flag symptoms and when to call.   Follow up as needed.    Gunnar Bulla, CNM [facility], [facility] 05/12/18 12:25 PM

## 2018-05-23 ENCOUNTER — Other Ambulatory Visit: Payer: Self-pay | Admitting: Certified Nurse Midwife

## 2018-05-27 ENCOUNTER — Encounter: Payer: 59 | Admitting: Certified Nurse Midwife

## 2018-07-08 ENCOUNTER — Other Ambulatory Visit: Payer: Self-pay | Admitting: Certified Nurse Midwife

## 2018-07-09 ENCOUNTER — Encounter: Payer: Self-pay | Admitting: Certified Nurse Midwife

## 2018-07-09 ENCOUNTER — Other Ambulatory Visit: Payer: Self-pay

## 2018-07-09 MED ORDER — PAROXETINE HCL 30 MG PO TABS: 30.0000 mg | ORAL_TABLET | Freq: Every day | ORAL | 2 refills | Status: DC

## 2018-09-11 ENCOUNTER — Other Ambulatory Visit: Payer: Self-pay | Admitting: Gastroenterology

## 2018-09-11 ENCOUNTER — Other Ambulatory Visit: Payer: Self-pay | Admitting: Neurosurgery

## 2018-09-11 DIAGNOSIS — R748 Abnormal levels of other serum enzymes: Secondary | ICD-10-CM

## 2018-09-16 ENCOUNTER — Other Ambulatory Visit: Payer: Self-pay

## 2018-09-16 ENCOUNTER — Ambulatory Visit
Admission: RE | Admit: 2018-09-16 | Discharge: 2018-09-16 | Disposition: A | Discharge status: Home / Self Care | Payer: BC Managed Care – PPO | Source: Ambulatory Visit | Attending: Gastroenterology | Admitting: Gastroenterology

## 2018-09-16 DIAGNOSIS — R748 Abnormal levels of other serum enzymes: Secondary | ICD-10-CM

## 2018-10-21 ENCOUNTER — Ambulatory Visit
Admission: RE | Admit: 2018-10-21 | Discharge: 2018-10-21 | Disposition: A | Discharge status: Home / Self Care | Payer: BC Managed Care – PPO | Source: Ambulatory Visit | Attending: Certified Nurse Midwife | Admitting: Certified Nurse Midwife

## 2018-10-21 DIAGNOSIS — N6489 Other specified disorders of breast: Secondary | ICD-10-CM

## 2018-10-22 ENCOUNTER — Other Ambulatory Visit: Payer: Self-pay | Admitting: Certified Nurse Midwife

## 2018-10-22 DIAGNOSIS — N6489 Other specified disorders of breast: Secondary | ICD-10-CM

## 2018-10-22 DIAGNOSIS — Z1231 Encounter for screening mammogram for malignant neoplasm of breast: Secondary | ICD-10-CM

## 2018-11-07 ENCOUNTER — Other Ambulatory Visit: Payer: Self-pay | Admitting: Internal Medicine

## 2018-11-07 DIAGNOSIS — R748 Abnormal levels of other serum enzymes: Secondary | ICD-10-CM

## 2018-11-11 ENCOUNTER — Encounter
Admission: RE | Admit: 2018-11-11 | Discharge: 2018-11-11 | Disposition: A | Discharge status: Home / Self Care | Payer: BC Managed Care – PPO | Source: Ambulatory Visit | Attending: Internal Medicine | Admitting: Internal Medicine

## 2018-11-11 ENCOUNTER — Other Ambulatory Visit: Payer: Self-pay

## 2018-11-11 DIAGNOSIS — R748 Abnormal levels of other serum enzymes: Secondary | ICD-10-CM | POA: Diagnosis present

## 2018-11-11 MED ORDER — TECHNETIUM TC 99M MEDRONATE IV KIT
21.7020 | PACK | Freq: Once | INTRAVENOUS | Status: AC | PRN
  Administered 2018-11-11: 21.702 via INTRAVENOUS

## 2018-11-15 ENCOUNTER — Other Ambulatory Visit: Payer: Self-pay | Admitting: Certified Nurse Midwife

## 2018-12-02 ENCOUNTER — Other Ambulatory Visit: Payer: Self-pay | Admitting: Certified Nurse Midwife

## 2018-12-08 ENCOUNTER — Other Ambulatory Visit: Payer: Self-pay | Admitting: Optometrist

## 2018-12-08 DIAGNOSIS — G8929 Other chronic pain: Secondary | ICD-10-CM

## 2018-12-08 DIAGNOSIS — M25552 Pain in left hip: Secondary | ICD-10-CM

## 2018-12-10 ENCOUNTER — Emergency Department: Payer: BC Managed Care – PPO

## 2018-12-10 ENCOUNTER — Other Ambulatory Visit: Payer: Self-pay

## 2018-12-10 ENCOUNTER — Inpatient Hospital Stay
Admission: EM | Admit: 2018-12-10 | Discharge: 2018-12-12 | DRG: 439 | Disposition: A | Discharge status: Home / Self Care | Payer: BC Managed Care – PPO | Attending: Internal Medicine | Admitting: Internal Medicine

## 2018-12-10 DIAGNOSIS — Z79890 Hormone replacement therapy (postmenopausal): Secondary | ICD-10-CM | POA: Diagnosis not present

## 2018-12-10 DIAGNOSIS — Z91013 Allergy to seafood: Secondary | ICD-10-CM | POA: Diagnosis not present

## 2018-12-10 DIAGNOSIS — R1013 Epigastric pain: Secondary | ICD-10-CM

## 2018-12-10 DIAGNOSIS — I10 Essential (primary) hypertension: Secondary | ICD-10-CM | POA: Diagnosis present

## 2018-12-10 DIAGNOSIS — M797 Fibromyalgia: Secondary | ICD-10-CM | POA: Diagnosis present

## 2018-12-10 DIAGNOSIS — Z20828 Contact with and (suspected) exposure to other viral communicable diseases: Secondary | ICD-10-CM | POA: Diagnosis present

## 2018-12-10 DIAGNOSIS — F329 Major depressive disorder, single episode, unspecified: Secondary | ICD-10-CM | POA: Diagnosis present

## 2018-12-10 DIAGNOSIS — K859 Acute pancreatitis without necrosis or infection, unspecified: Secondary | ICD-10-CM | POA: Diagnosis present

## 2018-12-10 DIAGNOSIS — E039 Hypothyroidism, unspecified: Secondary | ICD-10-CM | POA: Diagnosis present

## 2018-12-10 DIAGNOSIS — Z79899 Other long term (current) drug therapy: Secondary | ICD-10-CM | POA: Diagnosis not present

## 2018-12-10 DIAGNOSIS — E876 Hypokalemia: Secondary | ICD-10-CM | POA: Diagnosis not present

## 2018-12-10 DIAGNOSIS — Z888 Allergy status to other drugs, medicaments and biological substances status: Secondary | ICD-10-CM

## 2018-12-10 DIAGNOSIS — N390 Urinary tract infection, site not specified: Secondary | ICD-10-CM | POA: Diagnosis present

## 2018-12-10 DIAGNOSIS — R1011 Right upper quadrant pain: Secondary | ICD-10-CM

## 2018-12-10 LAB — CBC
HCT: 41.9 % (ref 36.0–46.0)
Hemoglobin: 14.5 g/dL (ref 12.0–15.0)
MCH: 30.5 pg (ref 26.0–34.0)
MCHC: 34.6 g/dL (ref 30.0–36.0)
MCV: 88.2 fL (ref 80.0–100.0)
Platelets: 193 10*3/uL (ref 150–400)
RBC: 4.75 MIL/uL (ref 3.87–5.11)
RDW: 12.3 % (ref 11.5–15.5)
WBC: 5.2 10*3/uL (ref 4.0–10.5)
nRBC: 0 % (ref 0.0–0.2)

## 2018-12-10 LAB — COMPREHENSIVE METABOLIC PANEL
ALT: 28 U/L (ref 0–44)
AST: 24 U/L (ref 15–41)
Albumin: 4 g/dL (ref 3.5–5.0)
Alkaline Phosphatase: 173 U/L — ABNORMAL HIGH (ref 38–126)
Anion gap: 12 (ref 5–15)
BUN: 13 mg/dL (ref 6–20)
CO2: 22 mmol/L (ref 22–32)
Calcium: 9.3 mg/dL (ref 8.9–10.3)
Chloride: 105 mmol/L (ref 98–111)
Creatinine, Ser: 0.67 mg/dL (ref 0.44–1.00)
GFR calc Af Amer: >60 mL/min (ref >60)
GFR calc non Af Amer: >60 mL/min (ref >60)
Glucose, Bld: 126 mg/dL — ABNORMAL HIGH (ref 70–99)
Potassium: 3.4 mmol/L — ABNORMAL LOW (ref 3.5–5.1)
Sodium: 139 mmol/L (ref 135–145)
Total Bilirubin: 0.7 mg/dL (ref 0.3–1.2)
Total Protein: 7.3 g/dL (ref 6.5–8.1)

## 2018-12-10 LAB — URINALYSIS, COMPLETE (UACMP) WITH MICROSCOPIC
Bacteria, UA: NONE SEEN
Bilirubin Urine: NEGATIVE
Glucose, UA: NEGATIVE mg/dL
Hgb urine dipstick: NEGATIVE
Ketones, ur: NEGATIVE mg/dL
Nitrite: NEGATIVE
Protein, ur: NEGATIVE mg/dL
Specific Gravity, Urine: 1.016 (ref 1.005–1.030)
pH: 6 (ref 5.0–8.0)

## 2018-12-10 LAB — LIPASE, BLOOD: Lipase: 56 U/L — ABNORMAL HIGH (ref 11–51)

## 2018-12-10 LAB — SARS CORONAVIRUS 2 BY RT PCR (HOSPITAL ORDER, PERFORMED IN ~~LOC~~ HOSPITAL LAB): SARS Coronavirus 2: NEGATIVE

## 2018-12-10 MED ORDER — IOHEXOL 300 MG/ML  SOLN
100.0000 mL | Freq: Once | INTRAMUSCULAR | Status: AC | PRN
  Administered 2018-12-10: 100 mL via INTRAVENOUS
  Filled 2018-12-10: qty 100

## 2018-12-10 MED ORDER — ONDANSETRON HCL 4 MG/2ML IJ SOLN
4.0000 mg | Freq: Once | INTRAMUSCULAR | Status: AC
  Administered 2018-12-10: 4 mg via INTRAVENOUS
  Filled 2018-12-10: qty 2

## 2018-12-10 MED ORDER — HYDROMORPHONE HCL 1 MG/ML IJ SOLN
1.0000 mg | INTRAMUSCULAR | Status: DC | PRN
  Administered 2018-12-11 (×2): 1 mg via INTRAVENOUS
  Filled 2018-12-10 (×2): qty 1

## 2018-12-10 MED ORDER — HYDROMORPHONE HCL 1 MG/ML IJ SOLN
1.0000 mg | INTRAMUSCULAR | Status: DC | PRN
  Administered 2018-12-10: 1 mg via INTRAVENOUS
  Filled 2018-12-10: qty 1

## 2018-12-10 MED ORDER — ACETAMINOPHEN 650 MG RE SUPP
650.0000 mg | Freq: Four times a day (QID) | RECTAL | Status: DC | PRN
  Filled 2018-12-10: qty 1

## 2018-12-10 MED ORDER — KETOROLAC TROMETHAMINE 30 MG/ML IJ SOLN
15.0000 mg | Freq: Four times a day (QID) | INTRAMUSCULAR | Status: AC | PRN
  Administered 2018-12-11 (×2): 15 mg via INTRAVENOUS
  Filled 2018-12-10 (×3): qty 1

## 2018-12-10 MED ORDER — ONDANSETRON HCL 4 MG/2ML IJ SOLN
4.0000 mg | Freq: Four times a day (QID) | INTRAMUSCULAR | Status: DC | PRN
  Administered 2018-12-10 – 2018-12-11 (×3): 4 mg via INTRAVENOUS
  Filled 2018-12-10 (×3): qty 2

## 2018-12-10 MED ORDER — SODIUM CHLORIDE 0.9 % IV SOLN
INTRAVENOUS | Status: AC
  Administered 2018-12-11 (×2): via INTRAVENOUS

## 2018-12-10 MED ORDER — ONDANSETRON HCL 4 MG PO TABS
4.0000 mg | ORAL_TABLET | Freq: Four times a day (QID) | ORAL | Status: DC | PRN
  Administered 2018-12-11 (×2): 4 mg via ORAL
  Filled 2018-12-10 (×2): qty 1

## 2018-12-10 MED ORDER — KETOROLAC TROMETHAMINE 30 MG/ML IJ SOLN
15.0000 mg | Freq: Once | INTRAMUSCULAR | Status: AC
  Administered 2018-12-10: 15 mg via INTRAVENOUS
  Filled 2018-12-10: qty 1

## 2018-12-10 MED ORDER — MORPHINE SULFATE (PF) 2 MG/ML IV SOLN
2.0000 mg | INTRAVENOUS | Status: DC | PRN
  Administered 2018-12-11: 2 mg via INTRAVENOUS
  Filled 2018-12-10: qty 1

## 2018-12-10 MED ORDER — ACETAMINOPHEN 325 MG PO TABS
650.0000 mg | ORAL_TABLET | Freq: Four times a day (QID) | ORAL | Status: DC | PRN
  Administered 2018-12-11 – 2018-12-12 (×3): 650 mg via ORAL
  Filled 2018-12-10 (×3): qty 2

## 2018-12-10 MED ORDER — MORPHINE SULFATE (PF) 4 MG/ML IV SOLN
4.0000 mg | Freq: Once | INTRAVENOUS | Status: AC
  Administered 2018-12-10: 4 mg via INTRAVENOUS
  Filled 2018-12-10: qty 1

## 2018-12-10 MED ORDER — SODIUM CHLORIDE 0.9 % IV BOLUS
1000.0000 mL | Freq: Once | INTRAVENOUS | Status: AC
  Administered 2018-12-10: 1000 mL via INTRAVENOUS

## 2018-12-10 MED ORDER — SODIUM CHLORIDE 0.9 % IV BOLUS
500.0000 mL | Freq: Once | INTRAVENOUS | Status: AC
  Administered 2018-12-10: 500 mL via INTRAVENOUS

## 2018-12-10 MED ORDER — MORPHINE SULFATE (PF) 4 MG/ML IV SOLN
4.0000 mg | INTRAVENOUS | Status: DC | PRN
  Administered 2018-12-10: 4 mg via INTRAVENOUS
  Filled 2018-12-10: qty 1

## 2018-12-10 MED ORDER — ENOXAPARIN SODIUM 40 MG/0.4ML ~~LOC~~ SOLN
40.0000 mg | SUBCUTANEOUS | Status: DC
  Administered 2018-12-11: 40 mg via SUBCUTANEOUS
  Filled 2018-12-10: qty 0.4

## 2018-12-10 NOTE — H&P (Signed)
[facility] -  at [facility]   PATIENT NAME: Melanie Beltran    MR#:  [id number]  DATE OF BIRTH:  October 20, 1965  DATE OF ADMISSION:  12/10/2018  PRIMARY CARE PHYSICIAN: Jerl MinaHedrick, James, MD   REQUESTING/REFERRING PHYSICIAN: Roxan Hockeyobinson, MD  CHIEF COMPLAINT:   Chief Complaint  Patient presents with  . Abdominal Pain    HISTORY OF PRESENT ILLNESS:  Melanie Beltran  is a 53 y.o. female who presents with chief complaint as above.  Patient presents to the ED with a complaint of abdominal pain.  She states that the past 2 days she has had a mild "nagging" sensation, with overt sharp epigastric pain radiating to her back for the past 24 hours or so.  Evaluation in the ED including imaging shows uncomplicated pancreatitis.  There is no outright evidence of gallstone or gallbladder disease.  Patient states that she is only an occasional drinker, with no alcohol use recently.  Hospitalist were called for admission  PAST MEDICAL HISTORY:   Past Medical History:  Diagnosis Date  . Fibromyalgia   . Hypertension   . Hypothyroidism   . PONV (postoperative nausea and vomiting)      PAST SURGICAL HISTORY:   Past Surgical History:  Procedure Laterality Date  . ABDOMINAL HYSTERECTOMY  1995  . AUGMENTATION MAMMAPLASTY  2004/2018  . LAPAROSCOPY N/A 04/14/2018   Procedure: LAPAROSCOPY OPERATIVE with biopsies;  Surgeon: Hildred Laserherry, Anika, MD;  Location: [facility] ORS;  Service: Gynecology;  Laterality: N/A;  . LEFT OOPHORECTOMY    . NASAL SINUS SURGERY    . REDUCTION MAMMAPLASTY Bilateral 2018     SOCIAL HISTORY:   Social History   Tobacco Use  . Smoking status: Never Smoker  . Smokeless tobacco: Never Used  Substance Use Topics  . Alcohol use: Yes    Comment: occasional       FAMILY HISTORY:   Family History  Problem Relation Age of Onset  . Cancer Mother   . Non-Hodgkin's lymphoma Father   . Breast cancer Neg Hx   . Ovarian cancer Neg Hx   . Colon cancer  Neg Hx      DRUG ALLERGIES:   Allergies  Allergen Reactions  . Mesalamine Anaphylaxis    Throat closes  . Shellfish Allergy Anaphylaxis and Nausea And Vomiting    Throat closes    MEDICATIONS AT HOME:   Prior to Admission medications   Medication Sig Start Date End Date Taking? Authorizing Provider  AIMOVIG 70 MG/ML SOAJ Inject 70 mg as directed every 30 (thirty) days. 03/29/18   [provider]  ALPRAZolam Prudy Feeler(XANAX) 0.5 MG tablet Take 1 tablet (0.5 mg total) by mouth at bedtime as needed for anxiety or sleep. May start with half of tablet. 05/12/18   Lawhorn, Vanessa DurhamJenkins Michelle, CNM  DEXILANT 60 MG capsule Take 60 mg by mouth daily. 02/19/18   [provider]  Elagolix Sodium (ORILISSA) 200 MG TABS Take 1 tablet by mouth daily. 04/24/18   Hildred Laserherry, Anika, MD  ibuprofen (ADVIL,MOTRIN) 800 MG tablet Take 1 tablet (800 mg total) by mouth every 8 (eight) hours as needed for mild pain. 04/14/18   Hildred Laserherry, Anika, MD  levothyroxine (SYNTHROID, LEVOTHROID) 50 MCG tablet Take 50 mcg by mouth daily. 10/17/17 10/17/18  [provider]  metoprolol succinate (TOPROL-XL) 25 MG 24 hr tablet Take 25 mg by mouth at bedtime.  05/07/17 05/07/18  [provider]  PARoxetine (PAXIL) 30 MG tablet TAKE 1 TABLET BY MOUTH DAILY 11/17/18  Gunnar Bulla, CNM    REVIEW OF SYSTEMS:  Review of Systems  Constitutional: Negative for chills, fever, malaise/fatigue and weight loss.  HENT: Negative for ear pain, hearing loss and tinnitus.   Eyes: Negative for blurred vision, double vision, pain and redness.  Respiratory: Negative for cough, hemoptysis and shortness of breath.   Cardiovascular: Negative for chest pain, palpitations, orthopnea and leg swelling.  Gastrointestinal: Positive for abdominal pain and nausea. Negative for constipation, diarrhea and vomiting.  Genitourinary: Negative for dysuria, frequency and hematuria.  Musculoskeletal: Negative for back pain, joint pain and  neck pain.  Skin:       No acne, rash, or lesions  Neurological: Negative for dizziness, tremors, focal weakness and weakness.  Endo/Heme/Allergies: Negative for polydipsia. Does not bruise/bleed easily.  Psychiatric/Behavioral: Negative for depression. The patient is not nervous/anxious and does not have insomnia.      VITAL SIGNS:   Vitals:   12/10/18 1458 12/10/18 1735  BP: 136/60 (!) 148/97  Pulse: 90 72  Resp: 16 18  Temp: 99 F (37.2 C)   TempSrc: Oral   SpO2: 100% 98%  Weight: 88.5 kg   Height: 5\' 2"  (1.575 m)    Wt Readings from Last 3 Encounters:  12/10/18 88.5 kg  04/24/18 87.5 kg  04/14/18 85.7 kg    PHYSICAL EXAMINATION:  Physical Exam  Vitals reviewed. Constitutional: She is oriented to person, place, and time. She appears well-developed and well-nourished. No distress.  HENT:  Head: Normocephalic and atraumatic.  Mouth/Throat: Oropharynx is clear and moist.  Eyes: Pupils are equal, round, and reactive to light. Conjunctivae and EOM are normal. No scleral icterus.  Neck: Normal range of motion. Neck supple. No JVD present. No thyromegaly present.  Cardiovascular: Normal rate, regular rhythm and intact distal pulses. Exam reveals no gallop and no friction rub.  No murmur heard. Respiratory: Effort normal and breath sounds normal. No respiratory distress. She has no wheezes. She has no rales.  GI: Soft. Bowel sounds are normal. She exhibits no distension. There is abdominal tenderness.  Musculoskeletal: Normal range of motion.        General: No edema.     Comments: No arthritis, no gout  Lymphadenopathy:    She has no cervical adenopathy.  Neurological: She is alert and oriented to person, place, and time. No cranial nerve deficit.  No dysarthria, no aphasia  Skin: Skin is warm and dry. No rash noted. No erythema.  Psychiatric: She has a normal mood and affect. Her behavior is normal. Judgment and thought content normal.    LABORATORY PANEL:    CBC Recent Labs  Lab 12/10/18 1501  WBC 5.2  HGB 14.5  HCT 41.9  PLT 193   ------------------------------------------------------------------------------------------------------------------  Chemistries  Recent Labs  Lab 12/10/18 1501  NA 139  K 3.4*  CL 105  CO2 22  GLUCOSE 126*  BUN 13  CREATININE 0.67  CALCIUM 9.3  AST 24  ALT 28  ALKPHOS 173*  BILITOT 0.7   ------------------------------------------------------------------------------------------------------------------  Cardiac Enzymes No results for input(s): TROPONINI in the last 168 hours. ------------------------------------------------------------------------------------------------------------------  RADIOLOGY:  Ct Abdomen Pelvis W Contrast  Result Date: 12/10/2018 CLINICAL DATA:  Acute abdominal pain EXAM: CT ABDOMEN AND PELVIS WITH CONTRAST TECHNIQUE: Multidetector CT imaging of the abdomen and pelvis was performed using the standard protocol following bolus administration of intravenous contrast. CONTRAST:  12/12/2018 OMNIPAQUE IOHEXOL 300 MG/ML  SOLN COMPARISON:  Ultrasound same day FINDINGS: Lower chest: The visualized heart size within normal  limits. No pericardial fluid/thickening. No hiatal hernia. The visualized portions of the lungs are clear. Hepatobiliary: There is mildly decreased density seen throughout the liver parenchyma.The main portal vein is patent. No evidence of calcified gallstones, gallbladder wall thickening or biliary dilatation. Pancreas: There is mildly increased fat stranding changes seen around the pancreatic head. No peripancreatic fluid collections are seen. No pancreatic ductal dilatation however seen. Spleen: Normal in size without focal abnormality. Adrenals/Urinary Tract: Both adrenal glands appear normal. The kidneys and collecting system appear normal without evidence of urinary tract calculus or hydronephrosis. Bladder is unremarkable. Stomach/Bowel: The stomach, small bowel, and  colon are normal in appearance. No inflammatory changes, wall thickening, or obstructive findings. Vascular/Lymphatic: There are no enlarged mesenteric, retroperitoneal, or pelvic lymph nodes. Scattered aortic atherosclerotic calcifications are seen without aneurysmal dilatation. Reproductive: The patient is status post hysterectomy. Other: No evidence of abdominal wall mass or hernia. Musculoskeletal: No acute or significant osseous findings. IMPRESSION: Findings which could be suggestive of acute mild pancreatitis surrounding the pancreatic head. No peripancreatic fluid collections or pancreatic ductal dilatation. Please correlate with the patient's clinical laboratory exam. Electronically Signed   By: Prudencio Pair M.D.   On: 12/10/2018 19:48   US Abdomen Limited Ruq  Result Date: 12/10/2018 CLINICAL DATA:  Severe right upper quadrant pain several days. EXAM: ULTRASOUND ABDOMEN LIMITED RIGHT UPPER QUADRANT COMPARISON:  09/16/2018 FINDINGS: Gallbladder: No gallstones or wall thickening visualized. No sonographic Murphy sign noted by sonographer. Previously seen possible small polyps not visualized on today's exam. Common bile duct: Diameter: 3.9 mm. Liver: Diffuse increased parenchymal echogenicity compatible with steatosis without focal mass. Portal vein is patent on color Doppler imaging with normal direction of blood flow towards the liver. Other: No free fluid. IMPRESSION: No acute findings. Hepatic steatosis without focal mass. Electronically Signed   By: Marin Olp M.D.   On: 12/10/2018 16:50    EKG:   Orders placed or performed during the hospital encounter of 12/10/18  . ED EKG  . ED EKG  . EKG 12-Lead  . EKG 12-Lead    IMPRESSION AND PLAN:  Principal Problem:   Pancreatitis -n.p.o., IV fluids, as needed analgesia and antiemetics, and lipase Active Problems:   HTN (hypertension) -home dose antihypertensives   Hypothyroidism -home dose thyroid replacement  Chart review performed  and case discussed with ED provider. Labs, imaging and/or ECG reviewed by provider and discussed with patient/family. Management plans discussed with the patient and/or family.  COVID-19 status: Pending  DVT PROPHYLAXIS: SubQ lovenox   GI PROPHYLAXIS:  PPI at home dose  ADMISSION STATUS: Inpatient     CODE STATUS: Full  TOTAL TIME TAKING CARE OF THIS PATIENT: 45 minutes.   This patient was evaluated in the context of the global COVID-19 pandemic, which necessitated consideration that the patient might be at risk for infection with the SARS-CoV-2 virus that causes COVID-19. Institutional protocols and algorithms that pertain to the evaluation of patients at risk for COVID-19 are in a state of rapid change based on information released by regulatory bodies including the CDC and federal and state organizations. These policies and algorithms were followed to the best of this provider's knowledge to date during the patient's care at this facility.  Ethlyn Daniels 12/10/2018, 10:02 PM  [facility]  Office  [phone]  CC: Primary care physician; [place] Pink, MD  Note:  This document was prepared using Dragon voice recognition software and may include unintentional dictation errors.

## 2018-12-10 NOTE — ED Notes (Signed)
In [place] 

## 2018-12-10 NOTE — ED Provider Notes (Signed)
[facility] Emergency Department Provider Note    First MD Initiated Contact with Patient 12/10/18 1811     (approximate)  I have reviewed the triage vital signs and the nursing notes.   HISTORY  Chief Complaint Abdominal Pain    HPI Melanie Beltran is a 53 y.o. female Melanie Beltran medical history presents the ER for 24 hours of epigastric pain now radiating through to her back.  Is having nausea but no vomiting.  Is never had pain like this before.  No measured fevers.  No chest pain or shortness of breath.  No cough.  Denies any diarrhea.  No dysuria.  States the pain is moderate to severe.    Past Medical History:  Diagnosis Date  . Fibromyalgia   . Hypertension   . Hypothyroidism   . PONV (postoperative nausea and vomiting)    Family History  Problem Relation Age of Onset  . Cancer Mother   . Non-Hodgkin's lymphoma Father   . Breast cancer Neg Hx   . Ovarian cancer Neg Hx   . Colon cancer Neg Hx    Past Surgical History:  Procedure Laterality Date  . ABDOMINAL HYSTERECTOMY  1995  . AUGMENTATION MAMMAPLASTY  2004/2018  . LAPAROSCOPY N/A 04/14/2018   Procedure: LAPAROSCOPY OPERATIVE with biopsies;  Surgeon: Rubie Maid, MD;  Location: [facility] ORS;  Service: Gynecology;  Laterality: N/A;  . LEFT OOPHORECTOMY    . NASAL SINUS SURGERY    . REDUCTION MAMMAPLASTY Bilateral 2018   There are no active problems to display for this patient.     Prior to Admission medications   Medication Sig Start Date End Date Taking? Authorizing Provider  AIMOVIG 70 MG/ML SOAJ Inject 70 mg as directed every 30 (thirty) days. 03/29/18   [provider]  ALPRAZolam Duanne Moron) 0.5 MG tablet Take 1 tablet (0.5 mg total) by mouth at bedtime as needed for anxiety or sleep. May start with half of tablet. 05/12/18   Lawhorn, Lara Mulch, CNM  DEXILANT 60 MG capsule Take 60 mg by mouth daily. 02/19/18   [provider]  Elagolix Sodium (ORILISSA) 200 MG  TABS Take 1 tablet by mouth daily. 04/24/18   Rubie Maid, MD  ibuprofen (ADVIL,MOTRIN) 800 MG tablet Take 1 tablet (800 mg total) by mouth every 8 (eight) hours as needed for mild pain. 04/14/18   Rubie Maid, MD  levothyroxine (SYNTHROID, LEVOTHROID) 50 MCG tablet Take 50 mcg by mouth daily. 10/17/17 10/17/18  [provider]  metoprolol succinate (TOPROL-XL) 25 MG 24 hr tablet Take 25 mg by mouth at bedtime.  05/07/17 05/07/18  [provider]  PARoxetine (PAXIL) 30 MG tablet TAKE 1 TABLET BY MOUTH DAILY 11/17/18   Lawhorn, Lara Mulch, CNM    Allergies Mesalamine and Shellfish allergy    Social History Social History   Tobacco Use  . Smoking status: Never Smoker  . Smokeless tobacco: Never Used  Substance Use Topics  . Alcohol use: Yes    Comment: occasional    . Drug use: Never    Review of Systems Patient denies headaches, rhinorrhea, blurry vision, numbness, shortness of breath, chest pain, edema, cough, abdominal pain, nausea, vomiting, diarrhea, dysuria, fevers, rashes or hallucinations unless otherwise stated above in HPI. ____________________________________________   PHYSICAL EXAM:  VITAL SIGNS: Vitals:   12/10/18 1458 12/10/18 1735  BP: 136/60 (!) 148/97  Pulse: 90 72  Resp: 16 18  Temp: 99 F (37.2 C)   SpO2: 100% 98%  Constitutional: Alert and oriented.  Eyes: Conjunctivae are normal.  Head: Atraumatic. Nose: No congestion/rhinnorhea. Mouth/Throat: Mucous membranes are moist.   Neck: No stridor. Painless ROM.  Cardiovascular: Normal rate, regular rhythm. Grossly normal heart sounds.  Good peripheral circulation. Respiratory: Normal respiratory effort.  No retractions. Lungs CTAB. Gastrointestinal: Soft and nontender. No distention. No abdominal bruits. No CVA tenderness. Genitourinary:  Musculoskeletal: No lower extremity tenderness nor edema.  No joint effusions. Neurologic:  Normal speech and language. No gross focal neurologic  deficits are appreciated. No facial droop Skin:  Skin is warm, dry and intact. No rash noted. Psychiatric: Mood and affect are normal. Speech and behavior are normal.  ____________________________________________   LABS (all labs ordered are listed, but only abnormal results are displayed)  Results for orders placed or performed during the hospital encounter of 12/10/18 (from the past 24 hour(s))  Lipase, blood     Status: Abnormal   Collection Time: 12/10/18  3:01 PM  Result Value Ref Range   Lipase 56 (H) 11 - 51 U/L  Comprehensive metabolic panel     Status: Abnormal   Collection Time: 12/10/18  3:01 PM  Result Value Ref Range   Sodium 139 135 - 145 mmol/L   Potassium 3.4 (L) 3.5 - 5.1 mmol/L   Chloride 105 98 - 111 mmol/L   CO2 22 22 - 32 mmol/L   Glucose, Bld 126 (H) 70 - 99 mg/dL   BUN 13 6 - 20 mg/dL   Creatinine, Ser 1.610.67 0.44 - 1.00 mg/dL   Calcium 9.3 8.9 - 09.610.3 mg/dL   Total Protein 7.3 6.5 - 8.1 g/dL   Albumin 4.0 3.5 - 5.0 g/dL   AST 24 15 - 41 U/L   ALT 28 0 - 44 U/L   Alkaline Phosphatase 173 (H) 38 - 126 U/L   Total Bilirubin 0.7 0.3 - 1.2 mg/dL   GFR calc non Af Amer >60 >60 mL/min   GFR calc Af Amer >60 >60 mL/min   Anion gap 12 5 - 15  CBC     Status: None   Collection Time: 12/10/18  3:01 PM  Result Value Ref Range   WBC 5.2 4.0 - 10.5 K/uL   RBC 4.75 3.87 - 5.11 MIL/uL   Hemoglobin 14.5 12.0 - 15.0 g/dL   HCT 04.541.9 40.936.0 - 81.146.0 %   MCV 88.2 80.0 - 100.0 fL   MCH 30.5 26.0 - 34.0 pg   MCHC 34.6 30.0 - 36.0 g/dL   RDW 91.412.3 78.211.5 - 95.615.5 %   Platelets 193 150 - 400 K/uL   nRBC 0.0 0.0 - 0.2 %  Urinalysis, Complete w Microscopic     Status: Abnormal   Collection Time: 12/10/18  7:30 PM  Result Value Ref Range   Color, Urine YELLOW (A) YELLOW   APPearance HAZY (A) CLEAR   Specific Gravity, Urine 1.016 1.005 - 1.030   pH 6.0 5.0 - 8.0   Glucose, UA NEGATIVE NEGATIVE mg/dL   Hgb urine dipstick NEGATIVE NEGATIVE   Bilirubin Urine NEGATIVE NEGATIVE    Ketones, ur NEGATIVE NEGATIVE mg/dL   Protein, ur NEGATIVE NEGATIVE mg/dL   Nitrite NEGATIVE NEGATIVE   Leukocytes,Ua LARGE (A) NEGATIVE   RBC / HPF 0-5 0 - 5 RBC/hpf   WBC, UA 11-20 0 - 5 WBC/hpf   Bacteria, UA NONE SEEN NONE SEEN   Squamous Epithelial / LPF 6-10 0 - 5   Mucus PRESENT    ____________________________________________  EKG My review and personal  interpretation at Time:   15:03 Indication: epigastric pain  Rate: 95  Rhythm: sinus Axis: normal Other: normal intervals, no stemi ____________________________________________  RADIOLOGY  I personally reviewed all radiographic images ordered to evaluate for the above acute complaints and reviewed radiology reports and findings.  These findings were personally discussed with the patient.  Please see medical record for radiology report.  ____________________________________________   PROCEDURES  Procedure(s) performed:  Procedures    Critical Care performed: no ____________________________________________   INITIAL IMPRESSION / ASSESSMENT AND PLAN / ED COURSE  Pertinent labs & imaging results that were available during my care of the patient were reviewed by me and considered in my medical decision making (see chart for details).   DDX: Pancreatitis, cholelithiasis, cholecystitis, gastritis, diverticulitis, enteritis, colitis  JAQUITA BESSIRE is a 53 y.o. who presents to the ED with epigastric pain now radiating through to her back concerning for the above differential.  Ultrasound was ordered out of triage given location of pain.  Fortunately no evidence of biliary pathology.  Does have mildly elevated lipase and given her description of pain will order CT imaging as well as provide IV fluids as well as IV pain medication  Clinical Course as of Dec 09 2099  Wed Dec 10, 2018  2100 Patient still complaining of severe abdominal pain.  Will give additional doses of IV pain medication.  Not tolerating p.o.   Presentation consistent with early acute pancreatitis.  Will discuss with hospitalist for admission for bowel rest, IVF and pain control.   [PR]    Clinical Course User Index [PR] Willy Eddy, MD    The patient was evaluated in Emergency Department today for the symptoms described in the history of present illness. He/she was evaluated in the context of the global COVID-19 pandemic, which necessitated consideration that the patient might be at risk for infection with the SARS-CoV-2 virus that causes COVID-19. Institutional protocols and algorithms that pertain to the evaluation of patients at risk for COVID-19 are in a state of rapid change based on information released by regulatory bodies including the CDC and federal and state organizations. These policies and algorithms were followed during the patient's care in the ED.  As part of my medical decision making, I reviewed the following data within the electronic MEDICAL RECORD NUMBER Nursing notes reviewed and incorporated, Labs reviewed, notes from prior ED visits and [place] Controlled Substance Database   ____________________________________________   FINAL CLINICAL IMPRESSION(S) / ED DIAGNOSES  Final diagnoses:  Epigastric pain  Acute pancreatitis, unspecified complication status, unspecified pancreatitis type      NEW MEDICATIONS STARTED DURING THIS VISIT:  New Prescriptions   No medications on file     Note:  This document was prepared using Dragon voice recognition software and may include unintentional dictation errors.    Willy Eddy, MD 12/10/18 2101

## 2018-12-10 NOTE — ED Triage Notes (Signed)
Reports middle upper abdominal pain intermittently over last few days, states pain radiates to her back. Nausea. Pt alert and oriented X4, cooperative, RR even and unlabored, color WNL. Pt in NAD.

## 2018-12-10 NOTE — ED Notes (Signed)
Pt c/o severe RUQ pain. Jumped out of chair on palpation

## 2018-12-11 LAB — BASIC METABOLIC PANEL
Anion gap: 9 (ref 5–15)
BUN: 10 mg/dL (ref 6–20)
CO2: 24 mmol/L (ref 22–32)
Calcium: 8.9 mg/dL (ref 8.9–10.3)
Chloride: 108 mmol/L (ref 98–111)
Creatinine, Ser: 0.62 mg/dL (ref 0.44–1.00)
GFR calc Af Amer: >60 mL/min (ref >60)
GFR calc non Af Amer: >60 mL/min (ref >60)
Glucose, Bld: 102 mg/dL — ABNORMAL HIGH (ref 70–99)
Potassium: 3.4 mmol/L — ABNORMAL LOW (ref 3.5–5.1)
Sodium: 141 mmol/L (ref 135–145)

## 2018-12-11 LAB — CBC
HCT: 40 % (ref 36.0–46.0)
Hemoglobin: 13.6 g/dL (ref 12.0–15.0)
MCH: 30.1 pg (ref 26.0–34.0)
MCHC: 34 g/dL (ref 30.0–36.0)
MCV: 88.5 fL (ref 80.0–100.0)
Platelets: 166 10*3/uL (ref 150–400)
RBC: 4.52 MIL/uL (ref 3.87–5.11)
RDW: 12.3 % (ref 11.5–15.5)
WBC: 3.6 10*3/uL — ABNORMAL LOW (ref 4.0–10.5)
nRBC: 0 % (ref 0.0–0.2)

## 2018-12-11 LAB — LIPASE, BLOOD: Lipase: 40 U/L (ref 11–51)

## 2018-12-11 LAB — HIV ANTIBODY (ROUTINE TESTING W REFLEX): HIV Screen 4th Generation wRfx: NONREACTIVE

## 2018-12-11 MED ORDER — PAROXETINE HCL 30 MG PO TABS
30.0000 mg | ORAL_TABLET | Freq: Every day | ORAL | Status: DC
  Administered 2018-12-11: 30 mg via ORAL
  Filled 2018-12-11 (×2): qty 1

## 2018-12-11 MED ORDER — PANTOPRAZOLE SODIUM 40 MG IV SOLR
40.0000 mg | Freq: Two times a day (BID) | INTRAVENOUS | Status: DC
  Administered 2018-12-11 (×2): 40 mg via INTRAVENOUS
  Filled 2018-12-11 (×2): qty 40

## 2018-12-11 MED ORDER — CEPHALEXIN 500 MG PO CAPS
500.0000 mg | ORAL_CAPSULE | Freq: Three times a day (TID) | ORAL | Status: DC
  Administered 2018-12-11 – 2018-12-12 (×3): 500 mg via ORAL
  Filled 2018-12-11 (×4): qty 1

## 2018-12-11 MED ORDER — POTASSIUM CHLORIDE 10 MEQ/100ML IV SOLN
10.0000 meq | INTRAVENOUS | Status: DC
  Administered 2018-12-11: 10 meq via INTRAVENOUS
  Filled 2018-12-11: qty 100

## 2018-12-11 MED ORDER — ONDANSETRON HCL 4 MG/2ML IJ SOLN: 4.0000 mg | INTRAMUSCULAR | Status: DC | PRN

## 2018-12-11 MED ORDER — POTASSIUM CHLORIDE CRYS ER 20 MEQ PO TBCR
40.0000 meq | EXTENDED_RELEASE_TABLET | Freq: Once | ORAL | Status: AC
  Administered 2018-12-11: 40 meq via ORAL
  Filled 2018-12-11: qty 2

## 2018-12-11 MED ORDER — POTASSIUM CHLORIDE CRYS ER 20 MEQ PO TBCR
40.0000 meq | EXTENDED_RELEASE_TABLET | Freq: Once | ORAL | Status: DC
  Filled 2018-12-11: qty 2

## 2018-12-11 MED ORDER — METOPROLOL SUCCINATE ER 25 MG PO TB24
25.0000 mg | ORAL_TABLET | Freq: Every day | ORAL | Status: DC
  Administered 2018-12-11: 25 mg via ORAL
  Filled 2018-12-11: qty 1

## 2018-12-11 MED ORDER — LEVOTHYROXINE SODIUM 50 MCG PO TABS
50.0000 ug | ORAL_TABLET | Freq: Every day | ORAL | Status: DC
  Administered 2018-12-12: 50 ug via ORAL
  Filled 2018-12-11: qty 1

## 2018-12-11 NOTE — Progress Notes (Signed)
[facility] [place] NAME: Melanie Beltran    MR#:  [id number]  DATE OF BIRTH:  [birth year]  SUBJECTIVE:   still with epigastric abdominal pain  REVIEW OF SYSTEMS:    Review of Systems  Constitutional: Negative for fever, chills weight loss HENT: Negative for ear pain, nosebleeds, congestion, facial swelling, rhinorrhea, neck pain, neck stiffness and ear discharge.   Respiratory: Negative for cough, shortness of breath, wheezing  Cardiovascular: Negative for chest pain, palpitations and leg swelling.  Gastrointestinal: + Epigastric abdominal pain with nausea and vomiting  Genitourinary: Negative for dysuria, urgency, frequency, hematuria Musculoskeletal: Negative for back pain or joint pain Neurological: Negative for dizziness, seizures, syncope, focal weakness,  numbness and headaches.  Hematological: Does not bruise/bleed easily.  Psychiatric/Behavioral: Negative for hallucinations, confusion, dysphoric mood    Tolerating Diet: no      DRUG ALLERGIES:   Allergies  Allergen Reactions  . Mesalamine Anaphylaxis    Throat closes  . Shellfish Allergy Anaphylaxis and Nausea And Vomiting    Throat closes    VITALS:  Blood pressure (!) 150/93, pulse 86, temperature 97.7 F (36.5 C), temperature source Oral, resp. rate 17, height 5\' 2"  (1.575 m), weight 88.5 kg, SpO2 91 %.  PHYSICAL EXAMINATION:  Constitutional: Appears well-developed and well-nourished. No distress. HENT: Normocephalic. Marland Kitchen Oropharynx is clear and moist.  Eyes: Conjunctivae and EOM are normal. PERRLA, no scleral icterus.  Neck: Normal ROM. Neck supple. No JVD. No tracheal deviation. CVS: RRR, S1/S2 +, no murmurs, no gallops, no carotid bruit.  Pulmonary: Effort and breath sounds normal, no stridor, rhonchi, wheezes, rales.  Abdominal: Soft. BS +,  no distension,  rebound or guarding.  Epigastric tenderness Musculoskeletal: Normal range of motion. No edema and no  tenderness.  Neuro: Alert. CN 2-12 grossly intact. No focal deficits. Skin: Skin is warm and dry. No rash noted. Psychiatric: Normal mood and affect.      LABORATORY PANEL:   CBC Recent Labs  Lab 12/11/18 0417  WBC 3.6*  HGB 13.6  HCT 40.0  PLT 166   ------------------------------------------------------------------------------------------------------------------  Chemistries  Recent Labs  Lab 12/10/18 1501 12/11/18 0417  NA 139 141  K 3.4* 3.4*  CL 105 108  CO2 22 24  GLUCOSE 126* 102*  BUN 13 10  CREATININE 0.67 0.62  CALCIUM 9.3 8.9  AST 24  --   ALT 28  --   ALKPHOS 173*  --   BILITOT 0.7  --    ------------------------------------------------------------------------------------------------------------------  Cardiac Enzymes No results for input(s): TROPONINI in the last 168 hours. ------------------------------------------------------------------------------------------------------------------  RADIOLOGY:  Ct Abdomen Pelvis W Contrast  Result Date: 12/10/2018 CLINICAL DATA:  Acute abdominal pain EXAM: CT ABDOMEN AND PELVIS WITH CONTRAST TECHNIQUE: Multidetector CT imaging of the abdomen and pelvis was performed using the standard protocol following bolus administration of intravenous contrast. CONTRAST:  163mL OMNIPAQUE IOHEXOL 300 MG/ML  SOLN COMPARISON:  Ultrasound same day FINDINGS: Lower chest: The visualized heart size within normal limits. No pericardial fluid/thickening. No hiatal hernia. The visualized portions of the lungs are clear. Hepatobiliary: There is mildly decreased density seen throughout the liver parenchyma.The main portal vein is patent. No evidence of calcified gallstones, gallbladder wall thickening or biliary dilatation. Pancreas: There is mildly increased fat stranding changes seen around the pancreatic head. No peripancreatic fluid collections are seen. No pancreatic ductal dilatation however seen. Spleen: Normal in size without focal  abnormality. Adrenals/Urinary Tract: Both adrenal glands appear normal. The kidneys and  collecting system appear normal without evidence of urinary tract calculus or hydronephrosis. Bladder is unremarkable. Stomach/Bowel: The stomach, small bowel, and colon are normal in appearance. No inflammatory changes, wall thickening, or obstructive findings. Vascular/Lymphatic: There are no enlarged mesenteric, retroperitoneal, or pelvic lymph nodes. Scattered aortic atherosclerotic calcifications are seen without aneurysmal dilatation. Reproductive: The patient is status post hysterectomy. Other: No evidence of abdominal wall mass or hernia. Musculoskeletal: No acute or significant osseous findings. IMPRESSION: Findings which could be suggestive of acute mild pancreatitis surrounding the pancreatic head. No peripancreatic fluid collections or pancreatic ductal dilatation. Please correlate with the patient's clinical laboratory exam. Electronically Signed   By: Jonna Clark M.D.   On: 12/10/2018 19:48   US Abdomen Limited Ruq  Result Date: 12/10/2018 CLINICAL DATA:  Severe right upper quadrant pain several days. EXAM: ULTRASOUND ABDOMEN LIMITED RIGHT UPPER QUADRANT COMPARISON:  09/16/2018 FINDINGS: Gallbladder: No gallstones or wall thickening visualized. No sonographic Murphy sign noted by sonographer. Previously seen possible small polyps not visualized on today's exam. Common bile duct: Diameter: 3.9 mm. Liver: Diffuse increased parenchymal echogenicity compatible with steatosis without focal mass. Portal vein is patent on color Doppler imaging with normal direction of blood flow towards the liver. Other: No free fluid. IMPRESSION: No acute findings. Hepatic steatosis without focal mass. Electronically Signed   By: Elberta Fortis M.D.   On: 12/10/2018 16:50     ASSESSMENT AND PLAN:    53 year old female with hypothyroidism who presented to the ER due to abdominal pain.  1.  Acute pancreatitis: Patient  continues to have epigastric abdominal pain with nausea and vomiting.  We will continue n.p.o. status with supportive management with IV fluids and pain control along with antiemetics. Abdominal ultrasound shows no gallbladder pathology.  Patient denies heavy alcohol use.  Etiology of acute pancreatitis unknown.  If patient has persistent nausea then will consult GI. PPI started as well.  2.  Hypokalemia: Replete as needed.  3.  UTI: Continue Keflex day 1 of 5  4.  Hypothyroidism: Continue Synthroid  5.  Hypertension: Continue metoprolol  6.  Depression: Continue Paxil   Management plans discussed with the patient and she is in agreement.  CODE STATUS: full  TOTAL TIME TAKING CARE OF THIS PATIENT: 31 minutes.     POSSIBLE D/C 1-2 days, DEPENDING ON CLINICAL CONDITION.   Adrian Saran M.D on 12/11/2018 at 10:56 AM  Between 7am to 6pm - Pager - [phone] After 6pm go to [email or web address] - password [email or web address]  [facility]  Office  [phone]  CC: Primary care physician; Jerl Mina, MD  Note: This dictation was prepared with Dragon dictation along with smaller phrase technology. Any transcriptional errors that result from this process are unintentional.

## 2018-12-12 ENCOUNTER — Other Ambulatory Visit: Payer: Self-pay | Admitting: Family Medicine

## 2018-12-12 DIAGNOSIS — N6489 Other specified disorders of breast: Secondary | ICD-10-CM

## 2018-12-12 LAB — COMPREHENSIVE METABOLIC PANEL
ALT: 30 U/L (ref 0–44)
AST: 27 U/L (ref 15–41)
Albumin: 3.6 g/dL (ref 3.5–5.0)
Alkaline Phosphatase: 151 U/L — ABNORMAL HIGH (ref 38–126)
Anion gap: 10 (ref 5–15)
BUN: 10 mg/dL (ref 6–20)
CO2: 22 mmol/L (ref 22–32)
Calcium: 9.2 mg/dL (ref 8.9–10.3)
Chloride: 107 mmol/L (ref 98–111)
Creatinine, Ser: 0.74 mg/dL (ref 0.44–1.00)
GFR calc Af Amer: >60 mL/min (ref >60)
GFR calc non Af Amer: >60 mL/min (ref >60)
Glucose, Bld: 98 mg/dL (ref 70–99)
Potassium: 4.1 mmol/L (ref 3.5–5.1)
Sodium: 139 mmol/L (ref 135–145)
Total Bilirubin: 0.7 mg/dL (ref 0.3–1.2)
Total Protein: 7 g/dL (ref 6.5–8.1)

## 2018-12-12 MED ORDER — PANTOPRAZOLE SODIUM 40 MG PO TBEC
40.0000 mg | DELAYED_RELEASE_TABLET | Freq: Two times a day (BID) | ORAL | Status: DC
  Administered 2018-12-12: 40 mg via ORAL
  Filled 2018-12-12: qty 1

## 2018-12-12 MED ORDER — CEPHALEXIN 500 MG PO CAPS: 500.0000 mg | ORAL_CAPSULE | Freq: Three times a day (TID) | ORAL | 0 refills | Status: AC

## 2018-12-12 NOTE — Discharge Summary (Signed)
[facility] [place] [facility]   PATIENT NAME: Melanie Beltran    MR#:  [id number]  DATE OF BIRTH:  1965/03/11  DATE OF ADMISSION:  12/10/2018 ADMITTING PHYSICIAN: Oralia Manis, MD  DATE OF DISCHARGE: 12/12/2018  PRIMARY CARE PHYSICIAN: Jerl Mina, MD    ADMISSION DIAGNOSIS:  Epigastric pain [R10.13] RUQ pain [R10.11] Acute pancreatitis, unspecified complication status, unspecified pancreatitis type [K85.90]  DISCHARGE DIAGNOSIS:  Principal Problem:   Pancreatitis Active Problems:   Hypothyroidism   HTN (hypertension)   Acute pancreatitis   SECONDARY DIAGNOSIS:   Past Medical History:  Diagnosis Date  . Fibromyalgia   . Hypertension   . Hypothyroidism   . PONV (postoperative nausea and vomiting)     HOSPITAL COURSE:  53 year old female with hypothyroidism who presented to the ER due to abdominal pain.  1.  Acute pancreatitis:   Etiology of acute pancreatitis unknown.   Patient does not drink alcohol.  Right upper quad ultrasound showed no evidence of gallstones.   Her symptoms have improved.  She is tolerating her diet.     2.  Hypokalemia: This was repleted 3.  UTI: She will continue Keflex day 2 of 54.  Hypothyroidism: Continue Synthroid  5.  Hypertension: Continue metoprolol  6.  Depression: Continue Paxil    DISCHARGE CONDITIONS AND DIET:   Stable for discharge regular diet  CONSULTS OBTAINED:    DRUG ALLERGIES:   Allergies  Allergen Reactions  . Mesalamine Anaphylaxis    Throat closes  . Shellfish Allergy Anaphylaxis and Nausea And Vomiting    Throat closes    DISCHARGE MEDICATIONS:   Allergies as of 12/12/2018      Reactions   Mesalamine Anaphylaxis   Throat closes   Shellfish Allergy Anaphylaxis, Nausea And Vomiting   Throat closes      Medication List    STOP taking these medications   ALPRAZolam 0.5 MG tablet Commonly known as: Xanax   diclofenac 50 MG EC tablet Commonly known as:  VOLTAREN   Elagolix Sodium 200 MG Tabs Commonly known as: Orilissa   ibuprofen 800 MG tablet Commonly known as: ADVIL     TAKE these medications   Aimovig 70 MG/ML Soaj Generic drug: Erenumab-aooe Inject 70 mg as directed every 30 (thirty) days.   cephALEXin 500 MG capsule Commonly known as: KEFLEX Take 1 capsule (500 mg total) by mouth every 8 (eight) hours for 4 days.   cholecalciferol 25 MCG (1000 UT) tablet Commonly known as: VITAMIN D3 Take 1,000 Units by mouth daily.   Dexilant 60 MG capsule Generic drug: dexlansoprazole Take 60 mg by mouth daily.   levothyroxine 50 MCG tablet Commonly known as: SYNTHROID Take 50 mcg by mouth daily.   metoprolol succinate 25 MG 24 hr tablet Commonly known as: TOPROL-XL Take 25 mg by mouth at bedtime.   PARoxetine 30 MG tablet Commonly known as: PAXIL TAKE 1 TABLET BY MOUTH DAILY         Today   CHIEF COMPLAINT:  Patient without abdominal pain and doing very well.  Tolerating diet.  Eager to go home VITAL SIGNS:  Blood pressure 125/73, pulse 71, temperature 98.4 F (36.9 C), temperature source Oral, resp. rate 20, height 5\' 2"  (1.575 m), weight 88.5 kg, SpO2 96 %.   REVIEW OF SYSTEMS:  Review of Systems  Constitutional: Negative.  Negative for chills, fever and malaise/fatigue.  HENT: Negative.  Negative for ear discharge, ear pain, hearing loss, nosebleeds and sore throat.   Eyes:  Negative.  Negative for blurred vision and pain.  Respiratory: Negative.  Negative for cough, hemoptysis, shortness of breath and wheezing.   Cardiovascular: Negative.  Negative for chest pain, palpitations and leg swelling.  Gastrointestinal: Negative.  Negative for abdominal pain, blood in stool, diarrhea, nausea and vomiting.  Genitourinary: Negative.  Negative for dysuria.  Musculoskeletal: Negative.  Negative for back pain.  Skin: Negative.   Neurological: Negative for dizziness, tremors, speech change, focal weakness, seizures  and headaches.  Endo/Heme/Allergies: Negative.  Does not bruise/bleed easily.  Psychiatric/Behavioral: Negative.  Negative for depression, hallucinations and suicidal ideas.     PHYSICAL EXAMINATION:  GENERAL:  53 y.o.-year-old patient lying in the bed with no acute distress.  NECK:  Supple, no jugular venous distention. No thyroid enlargement, no tenderness.  LUNGS: Normal breath sounds bilaterally, no wheezing, rales,rhonchi  No use of accessory muscles of respiration.  CARDIOVASCULAR: S1, S2 normal. No murmurs, rubs, or gallops.  ABDOMEN: Soft, non-tender, non-distended. Bowel sounds present. No organomegaly or mass.  EXTREMITIES: No pedal edema, cyanosis, or clubbing.  PSYCHIATRIC: The patient is alert and oriented x 3.  SKIN: No obvious rash, lesion, or ulcer.   DATA REVIEW:   CBC Recent Labs  Lab 12/11/18 0417  WBC 3.6*  HGB 13.6  HCT 40.0  PLT 166    Chemistries  Recent Labs  Lab 12/12/18 0647  NA 139  K 4.1  CL 107  CO2 22  GLUCOSE 98  BUN 10  CREATININE 0.74  CALCIUM 9.2  AST 27  ALT 30  ALKPHOS 151*  BILITOT 0.7    Cardiac Enzymes No results for input(s): TROPONINI in the last 168 hours.  Microbiology Results  @MICRORSLT48 @  RADIOLOGY:  Ct Abdomen Pelvis W Contrast  Result Date: 12/10/2018 CLINICAL DATA:  Acute abdominal pain EXAM: CT ABDOMEN AND PELVIS WITH CONTRAST TECHNIQUE: Multidetector CT imaging of the abdomen and pelvis was performed using the standard protocol following bolus administration of intravenous contrast. CONTRAST:  11mL OMNIPAQUE IOHEXOL 300 MG/ML  SOLN COMPARISON:  Ultrasound same day FINDINGS: Lower chest: The visualized heart size within normal limits. No pericardial fluid/thickening. No hiatal hernia. The visualized portions of the lungs are clear. Hepatobiliary: There is mildly decreased density seen throughout the liver parenchyma.The main portal vein is patent. No evidence of calcified gallstones, gallbladder wall  thickening or biliary dilatation. Pancreas: There is mildly increased fat stranding changes seen around the pancreatic head. No peripancreatic fluid collections are seen. No pancreatic ductal dilatation however seen. Spleen: Normal in size without focal abnormality. Adrenals/Urinary Tract: Both adrenal glands appear normal. The kidneys and collecting system appear normal without evidence of urinary tract calculus or hydronephrosis. Bladder is unremarkable. Stomach/Bowel: The stomach, small bowel, and colon are normal in appearance. No inflammatory changes, wall thickening, or obstructive findings. Vascular/Lymphatic: There are no enlarged mesenteric, retroperitoneal, or pelvic lymph nodes. Scattered aortic atherosclerotic calcifications are seen without aneurysmal dilatation. Reproductive: The patient is status post hysterectomy. Other: No evidence of abdominal wall mass or hernia. Musculoskeletal: No acute or significant osseous findings. IMPRESSION: Findings which could be suggestive of acute mild pancreatitis surrounding the pancreatic head. No peripancreatic fluid collections or pancreatic ductal dilatation. Please correlate with the patient's clinical laboratory exam. Electronically Signed   By: Prudencio Pair M.D.   On: 12/10/2018 19:48   US Abdomen Limited Ruq  Result Date: 12/10/2018 CLINICAL DATA:  Severe right upper quadrant pain several days. EXAM: ULTRASOUND ABDOMEN LIMITED RIGHT UPPER QUADRANT COMPARISON:  09/16/2018 FINDINGS: Gallbladder: No gallstones  or wall thickening visualized. No sonographic Murphy sign noted by sonographer. Previously seen possible small polyps not visualized on today's exam. Common bile duct: Diameter: 3.9 mm. Liver: Diffuse increased parenchymal echogenicity compatible with steatosis without focal mass. Portal vein is patent on color Doppler imaging with normal direction of blood flow towards the liver. Other: No free fluid. IMPRESSION: No acute findings. Hepatic steatosis  without focal mass. Electronically Signed   By: Elberta Fortisaniel  Boyle M.D.   On: 12/10/2018 16:50      Allergies as of 12/12/2018      Reactions   Mesalamine Anaphylaxis   Throat closes   Shellfish Allergy Anaphylaxis, Nausea And Vomiting   Throat closes      Medication List    STOP taking these medications   ALPRAZolam 0.5 MG tablet Commonly known as: Xanax   diclofenac 50 MG EC tablet Commonly known as: VOLTAREN   Elagolix Sodium 200 MG Tabs Commonly known as: Orilissa   ibuprofen 800 MG tablet Commonly known as: ADVIL     TAKE these medications   Aimovig 70 MG/ML Soaj Generic drug: Erenumab-aooe Inject 70 mg as directed every 30 (thirty) days.   cephALEXin 500 MG capsule Commonly known as: KEFLEX Take 1 capsule (500 mg total) by mouth every 8 (eight) hours for 4 days.   cholecalciferol 25 MCG (1000 UT) tablet Commonly known as: VITAMIN D3 Take 1,000 Units by mouth daily.   Dexilant 60 MG capsule Generic drug: dexlansoprazole Take 60 mg by mouth daily.   levothyroxine 50 MCG tablet Commonly known as: SYNTHROID Take 50 mcg by mouth daily.   metoprolol succinate 25 MG 24 hr tablet Commonly known as: TOPROL-XL Take 25 mg by mouth at bedtime.   PARoxetine 30 MG tablet Commonly known as: PAXIL TAKE 1 TABLET BY MOUTH DAILY          Management plans discussed with the patient and she is in agreement. Stable for discharge home  Patient should follow up with pcp  CODE STATUS:     Code Status Orders  (From admission, onward)         Start     Ordered   12/10/18 2240  Full code  Continuous     12/10/18 2239        Code Status History    This patient has a current code status but no historical code status.   Advance Care Planning Activity    Advance Directive Documentation     Most Recent Value  Type of Advance Directive  Living will  Pre-existing out of facility DNR order (yellow form or pink MOST form)  -  "MOST" Form in Place?  -       TOTAL TIME TAKING CARE OF THIS PATIENT: 38 minutes.    Note: This dictation was prepared with Dragon dictation along with smaller phrase technology. Any transcriptional errors that result from this process are unintentional.  Adrian SaranSital Feather Berrie M.D on 12/12/2018 at 9:48 AM  Between 7am to 6pm - Pager - [phone] After 6pm go to [email or web address] - Social research officer, governmentpassword [email or web address]  [facility] [place] [facility]  Office  [phone]  CC: Primary care physician; Jerl MinaHedrick, James, MD

## 2018-12-12 NOTE — Progress Notes (Signed)
PHARMACIST - PHYSICIAN COMMUNICATION  DR:   Benjie Karvonen  CONCERNING: IV to Oral Route Change Policy  RECOMMENDATION: This patient is receiving protonix by the intravenous route.  Based on criteria approved by the Pharmacy and Therapeutics Committee, the intravenous medication(s) is/are being converted to the equivalent oral dose form(s).   DESCRIPTION: These criteria include:  The patient is eating (either orally or via tube) and/or has been taking other orally administered medications for a least 24 hours  The patient has no evidence of active gastrointestinal bleeding or impaired GI absorption (gastrectomy, short bowel, patient on TNA or NPO).  If you have questions about this conversion, please contact the Pharmacy Department   []   [phone] )  [place], PharmD, BCPS Clinical Pharmacist 12/12/2018 7:56 AM

## 2018-12-12 NOTE — Progress Notes (Signed)
Melanie Beltran and O x 4 VSS. Pt tolerating diet well. No complaints of pain or nausea. IV removed intact, prescriptions given. Pt voices understanding of discharge instructions with no further questions. Pt discharged via wheelchair with axillary.   Allergies as of 12/12/2018      Reactions   Mesalamine Anaphylaxis   Throat closes   Shellfish Allergy Anaphylaxis, Nausea And Vomiting   Throat closes      Medication List    STOP taking these medications   ALPRAZolam 0.5 MG tablet Commonly known as: Xanax   diclofenac 50 MG EC tablet Commonly known as: VOLTAREN   Elagolix Sodium 200 MG Tabs Commonly known as: Orilissa   ibuprofen 800 MG tablet Commonly known as: ADVIL     TAKE these medications   Aimovig 70 MG/ML Soaj Generic drug: Erenumab-aooe Inject 70 mg as directed every 30 (thirty) days.   cephALEXin 500 MG capsule Commonly known as: KEFLEX Take 1 capsule (500 mg total) by mouth every 8 (eight) hours for 4 days.   cholecalciferol 25 MCG (1000 UT) tablet Commonly known as: VITAMIN D3 Take 1,000 Units by mouth daily.   Dexilant 60 MG capsule Generic drug: dexlansoprazole Take 60 mg by mouth daily.   levothyroxine 50 MCG tablet Commonly known as: SYNTHROID Take 50 mcg by mouth daily.   metoprolol succinate 25 MG 24 hr tablet Commonly known as: TOPROL-XL Take 25 mg by mouth at bedtime.   PARoxetine 30 MG tablet Commonly known as: PAXIL TAKE 1 TABLET BY MOUTH DAILY       Vitals:   12/11/18 1944 12/12/18 0629  BP: 137/74 125/73  Pulse: 84 71  Resp: 18 20  Temp: 98.7 F (37.1 C) 98.4 F (36.9 C)  SpO2: 94% 96%    Melanie Beltran

## 2018-12-18 ENCOUNTER — Other Ambulatory Visit: Payer: BC Managed Care – PPO

## 2018-12-29 ENCOUNTER — Ambulatory Visit
Admission: RE | Admit: 2018-12-29 | Discharge: 2018-12-29 | Disposition: A | Discharge status: Home / Self Care | Payer: BC Managed Care – PPO | Source: Ambulatory Visit | Attending: Optometrist | Admitting: Optometrist

## 2018-12-29 ENCOUNTER — Other Ambulatory Visit: Payer: Self-pay

## 2018-12-29 DIAGNOSIS — G8929 Other chronic pain: Secondary | ICD-10-CM | POA: Diagnosis present

## 2018-12-29 DIAGNOSIS — M25552 Pain in left hip: Secondary | ICD-10-CM | POA: Diagnosis present

## 2018-12-30 ENCOUNTER — Other Ambulatory Visit: Payer: Self-pay | Admitting: Family Medicine

## 2018-12-30 ENCOUNTER — Telehealth: Payer: Self-pay | Admitting: *Deleted

## 2018-12-30 DIAGNOSIS — I10 Essential (primary) hypertension: Secondary | ICD-10-CM

## 2018-12-30 DIAGNOSIS — E049 Nontoxic goiter, unspecified: Secondary | ICD-10-CM

## 2019-01-05 ENCOUNTER — Ambulatory Visit
Admission: RE | Admit: 2019-01-05 | Discharge: 2019-01-05 | Disposition: A | Discharge status: Home / Self Care | Payer: BC Managed Care – PPO | Source: Ambulatory Visit | Attending: Family Medicine | Admitting: Family Medicine

## 2019-01-05 ENCOUNTER — Other Ambulatory Visit: Payer: Self-pay

## 2019-01-05 DIAGNOSIS — E049 Nontoxic goiter, unspecified: Secondary | ICD-10-CM | POA: Diagnosis present

## 2019-01-05 DIAGNOSIS — I10 Essential (primary) hypertension: Secondary | ICD-10-CM | POA: Diagnosis present

## 2019-01-18 ENCOUNTER — Other Ambulatory Visit: Payer: Self-pay

## 2019-01-18 ENCOUNTER — Encounter: Payer: Self-pay | Admitting: Emergency Medicine

## 2019-01-18 ENCOUNTER — Ambulatory Visit
Admission: EM | Admit: 2019-01-18 | Discharge: 2019-01-18 | Disposition: A | Discharge status: Home / Self Care | Payer: BC Managed Care – PPO | Attending: Urgent Care | Admitting: Urgent Care

## 2019-01-18 DIAGNOSIS — Z20828 Contact with and (suspected) exposure to other viral communicable diseases: Secondary | ICD-10-CM

## 2019-01-18 DIAGNOSIS — Z20822 Contact with and (suspected) exposure to covid-19: Secondary | ICD-10-CM

## 2019-01-18 DIAGNOSIS — J019 Acute sinusitis, unspecified: Secondary | ICD-10-CM | POA: Diagnosis not present

## 2019-01-18 DIAGNOSIS — Z8616 Personal history of COVID-19: Secondary | ICD-10-CM

## 2019-01-18 HISTORY — DX: Personal history of COVID-19: Z86.16

## 2019-01-18 MED ORDER — AMOXICILLIN-POT CLAVULANATE 875-125 MG PO TABS: 1.0000 | ORAL_TABLET | Freq: Two times a day (BID) | ORAL | 0 refills | Status: AC

## 2019-01-18 MED ORDER — HYDROCOD POLST-CPM POLST ER 10-8 MG/5ML PO SUER: 5.0000 mL | Freq: Two times a day (BID) | ORAL | 0 refills | Status: DC | PRN

## 2019-01-18 NOTE — Discharge Instructions (Signed)
It was very nice seeing you today in clinic. Thank you for entrusting me with your care.   Rest and Stay HYDRATED. Water and electrolyte containing beverages (Gatorade, Pedialyte) are best to prevent dehydration and electrolyte abnormalities. Use medications as prescribed. May use Tylenol and/or Ibuprofen as needed for pain/fever.   COVID is still a possibility. You were tested for SARS-CoV-2 (novel coronavirus) today. Testing is performed by an outside lab ([facility]) and has variable turn around times ranging between 2-5 days. Current recommendations from the the CDC and [place] DHHS require that you remain out of work in order to quarantine at home until negative test results are have been received. In the event that your test results are positive, you will be contacted with further directives. These measures are being implemented out of an abundance of caution to prevent transmission and spread during the current SARS-CoV-2 pandemic.   Make arrangements to follow up with your regular doctor in 1 week for re-evaluation if not improving. If your symptoms/condition worsens, please seek follow up care either here or in the ER. Please remember, our [facility] providers are "right here with you" when you need [place].   Again, it was my pleasure to take care of you today. Thank you for choosing our clinic. I hope that you start to feel better quickly.   Honor Loh, MSN, APRN, FNP-C, CEN Advanced Practice Provider [facility]

## 2019-01-18 NOTE — ED Triage Notes (Signed)
Patient c/o sinus congestion and cough and chest congestion for the past 2-4 days.  Patient denies fevers.

## 2019-01-18 NOTE — ED Provider Notes (Signed)
[place], [place]   Name: Melanie CommentLaurie E Proctor DOB: 1965/11/15 MRN: [id number] CSN: [id number] PCP: Jerl MinaHedrick, James, MD  Arrival date and time:  01/18/19 1126  Chief Complaint:  Sinus Problem, Cough, and COVID Exposure   NOTE: Prior to seeing the patient today, I have reviewed the triage nursing documentation and vital signs. Clinical staff has updated patient's PMH/PSHx, current medication list, and drug allergies/intolerances to ensure comprehensive history available to assist in medical decision making.   History:   HPI: Melanie Beltran is a 53 y.o. female who presents today with complaints of cough, pleuritic chest pain, LEFT ear pain, and sinus tenderness that began approximately 3-4 days ago. Patient denies fevers. Cough has been non-productive and she advises that it is worse at night. PMH (+) for recurrent sinus infections per her report. Patient states, "I don't feel horrible. I feel like this is like regular sinus infections that I have had in the past. Patient denies that she has experienced any nausea, vomiting, diarrhea, or abdominal pain. She is eating and drinking well. Patient denies any perceived alterations to hersense of taste or smell. Patient has been in close contact with a family member who has tested positive for SARS-CoV-2 (novel coronavirus) and presents today with requests for testing. Patient has not been tested for SARS-CoV-2 in the last 14 days; last tested negative on 12/10/2018. In efforts to conservatively manage her symptoms at home, the patient notes that she has used mucinex, APAP, and "cough/cold" medication, which has not helped to improve her symptoms.    Past Medical History:  Diagnosis Date   Fibromyalgia    Hypertension    Hypothyroidism    PONV (postoperative nausea and vomiting)     Past Surgical History:  Procedure Laterality Date   ABDOMINAL HYSTERECTOMY  1995   AUGMENTATION MAMMAPLASTY  2004/2018   LAPAROSCOPY N/A 04/14/2018   Procedure:  LAPAROSCOPY OPERATIVE with biopsies;  Surgeon: Hildred Laserherry, Anika, MD;  Location: [facility] ORS;  Service: Gynecology;  Laterality: N/A;   LEFT OOPHORECTOMY     NASAL SINUS SURGERY     REDUCTION MAMMAPLASTY Bilateral 2018    Family History  Problem Relation Age of Onset   Cancer Mother    Non-Hodgkin's lymphoma Father    Breast cancer Neg Hx    Ovarian cancer Neg Hx    Colon cancer Neg Hx     Social History   Tobacco Use   Smoking status: Never Smoker   Smokeless tobacco: Never Used  Substance Use Topics   Alcohol use: Yes    Comment: occasional     Drug use: Never    Patient Active Problem List   Diagnosis Date Noted   Hypothyroidism 12/10/2018   HTN (hypertension) 12/10/2018   Pancreatitis 12/10/2018   Acute pancreatitis 12/10/2018    Home Medications:    Current Meds  Medication Sig   cholecalciferol (VITAMIN D3) 25 MCG (1000 UT) tablet Take 1,000 Units by mouth daily.   DEXILANT 60 MG capsule Take 60 mg by mouth daily.   levothyroxine (SYNTHROID, LEVOTHROID) 50 MCG tablet Take 50 mcg by mouth daily.   metoprolol succinate (TOPROL-XL) 25 MG 24 hr tablet Take 25 mg by mouth at bedtime.    PARoxetine (PAXIL) 30 MG tablet TAKE 1 TABLET BY MOUTH DAILY    Allergies:   Mesalamine and Shellfish allergy  Review of Systems (ROS): Review of Systems  Constitutional: Negative for fatigue and fever.  HENT: Positive for congestion, ear pain, sinus pressure and sinus pain. Negative  for ear discharge, postnasal drip, rhinorrhea, sneezing and sore throat.   Eyes: Negative for pain, discharge and redness.  Respiratory: Positive for cough. Negative for chest tightness and shortness of breath.   Cardiovascular: Positive for chest pain (pleuritic; ribs). Negative for palpitations.  Gastrointestinal: Negative for abdominal pain, diarrhea, nausea and vomiting.  Musculoskeletal: Negative for arthralgias, back pain, myalgias and neck pain.  Skin: Negative for color  change, pallor and rash.  Neurological: Negative for dizziness, syncope, weakness and headaches.  Hematological: Negative for adenopathy.  Psychiatric/Behavioral: Positive for sleep disturbance (2/2 cough).     Vital Signs: Today's Vitals   01/18/19 1138 01/18/19 1143 01/18/19 1229  BP:  (!) 147/90   Pulse:  79   Resp:  16   Temp:  98.2 F (36.8 C)   TempSrc:  Oral   SpO2:  98%   Weight: 190 lb (86.2 kg)    Height: 5\' 2"  (1.575 m)    PainSc: 5   5     Physical Exam: Physical Exam  Constitutional: She is oriented to person, place, and time and well-developed, well-nourished, and in no distress.  Acutely ill appearing; fatigued/listless.  HENT:  Head: Normocephalic and atraumatic.  Right Ear: Tympanic membrane normal.  Left Ear: There is tenderness. Tympanic membrane is erythematous and bulging (mild). A middle ear effusion (suppurative) is present.  Nose: Mucosal edema and sinus tenderness present.  Mouth/Throat: Uvula is midline and mucous membranes are normal. Posterior oropharyngeal erythema present. No oropharyngeal exudate or posterior oropharyngeal edema.  Eyes: Pupils are equal, round, and reactive to light.  Neck: Normal range of motion. Neck supple.  Cardiovascular: Normal rate, regular rhythm, normal heart sounds and intact distal pulses.  Pulmonary/Chest: Effort normal and breath sounds normal.  Deep cough in clinic. BILATERAL lateral rib pain with cough and deep inspiration. SPO2 98% on RA.   Musculoskeletal: Normal range of motion.  Lymphadenopathy:       Head (left side): Submandibular adenopathy present.  Neurological: She is alert and oriented to person, place, and time. Gait normal.  Skin: Skin is warm and dry. No rash noted. She is not diaphoretic.  Psychiatric: Mood, memory, affect and judgment normal.  Nursing note and vitals reviewed.   Urgent Care Treatments / Results:  LABS: PLEASE NOTE: all labs that were ordered this encounter are listed, however  only abnormal results are displayed. Labs Reviewed  NOVEL CORONAVIRUS, NAA (HOSP ORDER, SEND-OUT TO REF LAB; TAT 18-24 HRS)    EKG: -None  RADIOLOGY: No results found.  PROCEDURES: Procedures  MEDICATIONS RECEIVED THIS VISIT: Medications - No data to display  PERTINENT CLINICAL COURSE NOTES/UPDATES:   Initial Impression / Assessment and Plan / Urgent Care Course:  Pertinent labs & imaging results that were available during my care of the patient were personally reviewed by me and considered in my medical decision making (see lab/imaging section of note for values and interpretations).  Melanie Beltran is a 53 y.o. female who presents to [facility] today with complaints of Sinus Problem, Cough, and COVID Exposure   Patient acutely ill appearing (non-toxic). She does not appear to be in any acute distress today in clinic. Presenting symptoms (see HPI) and exam as documented above. She presents with symptoms associated with SARS-CoV-2 (novel coronavirus). She has been exposed to a family member who has tested positive for SARS-CoV-2 (novel coronavirus). Discussed typical symptom constellation. Reviewed potential for infection and need for testing. Patient amenable to being tested. SARS-CoV-2 swab collected by  certified clinical staff. Discussed variable turn around times associated with testing, as swabs are being processed at [facility], and have been taking between 2-5 days to come back. She was advised to self quarantine, per [facility] DHHS guidelines, until negative results received.   Patient with PMH (+) for recurrent sinus infections. Current symptoms are similar. LEFT ear with a suppurative effusion. Given history and clinical exam, will proceed with treatment for acute sinusitis using a 7 day course of amoxicillin-clavulanate. Additionally, patient with significant cough causing her to experience pain in her ribs. She is having difficulty sleeping. Will send in a supply of Tussionex  for PRN use; aware that this may cause somnolence. Discussed that until ruled out with confirmatory lab testing, SARS-CoV-2 also remains part of the differential. Her testing is pending at this time. Discussed supportive care measures at home during acute phase of illness. Patient to rest as much as possible. She was encouraged to ensure adequate hydration (water and ORS) to prevent dehydration and electrolyte derangements. Patient may use APAP and/or IBU on an as needed basis for pain/fever.    Discussed follow up with primary care physician in 1 week for re-evaluation. I have reviewed the follow up and strict return precautions for any new or worsening symptoms. Patient is aware of symptoms that would be deemed urgent/emergent, and would thus require further evaluation either here or in the emergency department. At the time of discharge, she verbalized understanding and consent with the discharge plan as it was reviewed with her. All questions were fielded by provider and/or clinic staff prior to patient discharge.    Final Clinical Impressions / Urgent Care Diagnoses:   Final diagnoses:  Acute non-recurrent sinusitis, unspecified location  Exposure to COVID-19 virus  Encounter for laboratory testing for COVID-19 virus    New Prescriptions:  [place] Controlled Substance Registry consulted? Yes, I have consulted the [place] Controlled Substances Registry for this patient, and feel the risk/benefit ratio today is favorable for proceeding with this prescription for a controlled substance.   Discussed use of controlled substance medication to treat her acute symptoms.  o Reviewed  STOP Act regulations  o Clinic does not refill controlled substances over the phone without face to face evaluation.   Safety precautions reviewed.  o Medications should not be sold or taken with alcohol.  o Avoid use while working, driving, or operating heavy machinery.  o Side effects associated with the use of this  particular medication reviewed. - Patient understands that this medication can cause CNS depression, increase her risk of falls, and even lead to overdose that may result in death, if used outside of the parameters that she and I discussed.  With all of this in mind, she knowingly accepts the risks and responsibilities associated with intended course of treatment, and elects to responsibly proceed as discussed.   Meds ordered this encounter  Medications   amoxicillin-clavulanate (AUGMENTIN) 875-125 MG tablet    Sig: Take 1 tablet by mouth 2 (two) times daily for 7 days.    Dispense:  14 tablet    Refill:  0   chlorpheniramine-HYDROcodone (TUSSIONEX PENNKINETIC ER) 10-8 MG/5ML SUER    Sig: Take 5 mLs by mouth every 12 (twelve) hours as needed for cough.    Dispense:  100 mL    Refill:  0    Recommended Follow up Care:  Patient encouraged to follow up with the following provider within the specified time frame, or sooner as dictated by the severity of her  symptoms. As always, she was instructed that for any urgent/emergent care needs, she should seek care either here or in the emergency department for more immediate evaluation.  Follow-up Information    Maryland Pink, MD In 1 week.   Specialty: Family Medicine Why: General reassessment of symptoms if not improving Contact information: [address] [place] [address] [phone]         NOTE: This note was prepared using Dragon dictation software along with smaller phrase technology. Despite my best ability to proofread, there is the potential that transcriptional errors may still occur from this process, and are completely unintentional.     Karen Kitchens, NP 01/18/19 2041

## 2019-01-20 LAB — NOVEL CORONAVIRUS, NAA (HOSP ORDER, SEND-OUT TO REF LAB; TAT 18-24 HRS): SARS-CoV-2, NAA: DETECTED — AB

## 2019-01-21 ENCOUNTER — Telehealth (HOSPITAL_COMMUNITY): Payer: Self-pay | Admitting: Emergency Medicine

## 2019-01-21 NOTE — Telephone Encounter (Signed)

## 2019-02-24 ENCOUNTER — Encounter: Payer: Self-pay | Admitting: *Deleted

## 2019-02-24 ENCOUNTER — Emergency Department: Payer: BC Managed Care – PPO

## 2019-02-24 ENCOUNTER — Emergency Department
Admission: EM | Admit: 2019-02-24 | Discharge: 2019-02-24 | Disposition: A | Discharge status: Home / Self Care | Payer: BC Managed Care – PPO | Attending: Emergency Medicine | Admitting: Emergency Medicine

## 2019-02-24 ENCOUNTER — Other Ambulatory Visit: Payer: Self-pay

## 2019-02-24 DIAGNOSIS — R110 Nausea: Secondary | ICD-10-CM | POA: Insufficient documentation

## 2019-02-24 DIAGNOSIS — R0789 Other chest pain: Secondary | ICD-10-CM | POA: Insufficient documentation

## 2019-02-24 DIAGNOSIS — Z5321 Procedure and treatment not carried out due to patient leaving prior to being seen by health care provider: Secondary | ICD-10-CM | POA: Insufficient documentation

## 2019-02-24 LAB — CBC WITH DIFFERENTIAL/PLATELET
Abs Immature Granulocytes: 0.01 10*3/uL (ref 0.00–0.07)
Basophils Absolute: 0.1 10*3/uL (ref 0.0–0.1)
Basophils Relative: 1 %
Eosinophils Absolute: 0.1 10*3/uL (ref 0.0–0.5)
Eosinophils Relative: 3 %
HCT: 42.5 % (ref 36.0–46.0)
Hemoglobin: 14.6 g/dL (ref 12.0–15.0)
Immature Granulocytes: 0 %
Lymphocytes Relative: 44 %
Lymphs Abs: 2.5 10*3/uL (ref 0.7–4.0)
MCH: 29.9 pg (ref 26.0–34.0)
MCHC: 34.4 g/dL (ref 30.0–36.0)
MCV: 87.1 fL (ref 80.0–100.0)
Monocytes Absolute: 0.5 10*3/uL (ref 0.1–1.0)
Monocytes Relative: 9 %
Neutro Abs: 2.4 10*3/uL (ref 1.7–7.7)
Neutrophils Relative %: 43 %
Platelets: 210 10*3/uL (ref 150–400)
RBC: 4.88 MIL/uL (ref 3.87–5.11)
RDW: 12.3 % (ref 11.5–15.5)
WBC: 5.6 10*3/uL (ref 4.0–10.5)
nRBC: 0 % (ref 0.0–0.2)

## 2019-02-24 LAB — COMPREHENSIVE METABOLIC PANEL
ALT: 32 U/L (ref 0–44)
AST: 22 U/L (ref 15–41)
Albumin: 4.2 g/dL (ref 3.5–5.0)
Alkaline Phosphatase: 160 U/L — ABNORMAL HIGH (ref 38–126)
Anion gap: 5 (ref 5–15)
BUN: 16 mg/dL (ref 6–20)
CO2: 30 mmol/L (ref 22–32)
Calcium: 9.9 mg/dL (ref 8.9–10.3)
Chloride: 105 mmol/L (ref 98–111)
Creatinine, Ser: 0.78 mg/dL (ref 0.44–1.00)
GFR calc Af Amer: >60 mL/min (ref >60)
GFR calc non Af Amer: >60 mL/min (ref >60)
Glucose, Bld: 90 mg/dL (ref 70–99)
Potassium: 3.9 mmol/L (ref 3.5–5.1)
Sodium: 140 mmol/L (ref 135–145)
Total Bilirubin: 0.7 mg/dL (ref 0.3–1.2)
Total Protein: 7.6 g/dL (ref 6.5–8.1)

## 2019-02-24 LAB — TROPONIN I (HIGH SENSITIVITY)
Troponin I (High Sensitivity): 3 ng/L (ref <18)
Troponin I (High Sensitivity): <2 ng/L (ref <18)

## 2019-02-24 NOTE — ED Triage Notes (Signed)
Pt to ED reporting sudden onset of left sided chest pain radiating to left arm and into neck and jaw. Pain started 45 minutes ago and has caused pt to feel nauseous. No vomiting, dizziness, SOB or lightheadedness. Hx of HTN but no cardiac hx per pt.   Pain started while pt was standing still talking.

## 2019-02-24 NOTE — ED Notes (Signed)
Pt reports unable to wait any longer due to long wait and now leaving; will f/u with PCP in morning and instr to return for any new or worsening symptoms

## 2019-04-10 ENCOUNTER — Other Ambulatory Visit: Payer: BC Managed Care – PPO

## 2019-04-16 ENCOUNTER — Ambulatory Visit
Admission: RE | Admit: 2019-04-16 | Discharge: 2019-04-16 | Disposition: A | Discharge status: Home / Self Care | Payer: BC Managed Care – PPO | Source: Ambulatory Visit | Attending: Family Medicine | Admitting: Family Medicine

## 2019-04-16 DIAGNOSIS — N6489 Other specified disorders of breast: Secondary | ICD-10-CM | POA: Insufficient documentation

## 2019-04-21 ENCOUNTER — Other Ambulatory Visit: Payer: BC Managed Care – PPO

## 2019-11-24 ENCOUNTER — Other Ambulatory Visit: Payer: Self-pay | Admitting: Orthopedic Surgery

## 2019-11-24 DIAGNOSIS — M25561 Pain in right knee: Secondary | ICD-10-CM

## 2019-11-25 ENCOUNTER — Ambulatory Visit
Admission: RE | Admit: 2019-11-25 | Discharge: 2019-11-25 | Disposition: A | Discharge status: Home / Self Care | Payer: BC Managed Care – PPO | Source: Ambulatory Visit | Attending: Orthopedic Surgery | Admitting: Orthopedic Surgery

## 2019-11-25 ENCOUNTER — Other Ambulatory Visit: Payer: Self-pay

## 2019-11-25 DIAGNOSIS — M25561 Pain in right knee: Secondary | ICD-10-CM | POA: Diagnosis not present

## 2019-12-02 ENCOUNTER — Ambulatory Visit: Payer: Self-pay

## 2019-12-14 DIAGNOSIS — B029 Zoster without complications: Secondary | ICD-10-CM

## 2019-12-14 HISTORY — DX: Zoster without complications: B02.9

## 2019-12-23 ENCOUNTER — Other Ambulatory Visit: Payer: Self-pay | Admitting: Surgery

## 2019-12-31 ENCOUNTER — Other Ambulatory Visit: Payer: Self-pay

## 2019-12-31 ENCOUNTER — Encounter
Admission: RE | Admit: 2019-12-31 | Discharge: 2019-12-31 | Disposition: A | Discharge status: Home / Self Care | Payer: BC Managed Care – PPO | Source: Ambulatory Visit | Attending: Surgery | Admitting: Surgery

## 2019-12-31 HISTORY — DX: Gastro-esophageal reflux disease without esophagitis: K21.9

## 2019-12-31 HISTORY — DX: Unspecified osteoarthritis, unspecified site: M19.90

## 2019-12-31 NOTE — Pre-Procedure Instructions (Signed)
ECG 12 Lead   Ref Range & Units 4 wk ago  EKG Systolic BP mmHg      EKG Diastolic BP mmHg      EKG Ventricular Rate BPM 83      EKG Atrial Rate BPM 83      EKG P-R Interval ms 156      EKG QRS Duration ms 90      EKG Q-T Interval ms 362      EKG QTC Calculation ms 425      EKG Calculated P Axis degrees 58      EKG Calculated R Axis degrees 67      EKG Calculated T Axis degrees 49      QTC Fredericia ms 403      Resulting Agency  [facility]  Narrative Performed by [facility] NORMAL SINUS RHYTHM  NORMAL ECG  WHEN COMPARED WITH ECG OF 02-Dec-2019 12:38,  NO SIGNIFICANT CHANGE WAS FOUND  Confirmed by Warnell Forester ([id number]) on 12/03/2019 12:47:41 PM Procedure Note  Interface, Rad Results In - 12/03/2019 12:47 PM EDT  Formatting of this note might be different from the original.  NORMAL SINUS RHYTHM  NORMAL ECG  WHEN COMPARED WITH ECG OF 02-Dec-2019 12:38,  NO SIGNIFICANT CHANGE WAS FOUND  Confirmed by Fabienne Bruns) on 12/03/2019 12:47:41 PM Specimen Collected: 12/02/19 3:23 PM Last Resulted: 12/03/19 12:47 PM  Received From: [facility]  Result Received: 12/14/19 7:58 AM

## 2019-12-31 NOTE — Pre-Procedure Instructions (Signed)
Progress Notes - documented in this encounter  Melanie MillardParaschos, Alexander, MD - 03/04/2019 9:45 AM EST Formatting of this note is different from the original. New Patient Visit   Chief Complaint: Chief Complaint  Patient presents with  . Establ[facility]  [facility]  . Chest Pain  stabs, pulling radiates to left arm  . Shortness of Breath  steps  . Fatigue  Date of Service: 03/04/2019 Date of Birth: 03/16/1965 PCP: Sandie AnoHedrick, James F, MD  History of Present Illness: Melanie Beltran is a 54 y.o.female patient who is referred for evaluation of chest pain and essential hypertension. Patient was seen at med in urgent care 01/18/2019 with cough and pleuritic chest pain, diagnosed with left ear infection, treated with Augmentin and tested positive for Covid. [facility]2019 with left-sided chest pain, pleuritic in nature, with negative troponin, and nondiagnostic ECG which revealed normal sinus rhythm with RSR prime in lead V1. The patient was seen in follow-up by her primary care provider 02/25/2019 at which time 24-hour Holter monitor was placed, results pending. She was referred for further evaluation of chest pain. She describes substernal chest discomfort, with radiation to left arm and occasional radiation to left neck which occur with and without exertion, comes and goes. Episode of chest pain occur 2-3 times a week, typically lasting 10 minutes. Chest pain is associated with shortness of breath and occasionally with nausea. She denies palpitations or heart racing. She denies peripheral edema. The patient is active but does not exercise regularly.  The patient has essential hypertension, blood pressure mildly elevated, currently on metoprolol succinate which is well-tolerated without apparent side effects. The patient follows a low-sodium, no added salt diet.  Past Medical and Surgical History  Past Medical History Past Medical History:  Diagnosis Date  . Allergic state  . Asthma  without status asthmaticus, unspecified  . Essential hypertension  . GERD (gastroesophageal reflux disease)  . History of chicken pox  . Hypothyroidism  . IBS (irritable bowel syndrome) 2000  [facility]  . Ileitis, unspecified  . Migraine headache  . Recurrent sinusitis  . Situational depression   Past Surgical History She has a past surgical history that includes Hysterectomy; Cataract extraction (Bilateral); Functional endoscopic sinus surgery; Colonoscopy (01/22/2012, 10/25/2008); egd (01/22/2012, 01/18/2001); Fracture surgery (Left, 10/24/09); Colonoscopy (02/03/2000); and flexible sigmoidoscopy (07/01/1990).   Medications and Allergies  Current Medications Current Outpatient Medications  Medication Sig Dispense Refill  . albuterol 90 mcg/actuation inhaler Inhale 2 inhalations into the lungs every 4 (four) hours as needed for Wheezing or Shortness of Breath 1 Inhaler 0  . cholecalciferol (D3-2000) 2,000 unit capsule Take 5,000 Units by mouth once daily  . cyclobenzaprine (FLEXERIL) 10 MG tablet Take 1 tablet (10 mg total) by mouth 3 (three) times daily as needed for Muscle spasms for up to 10 days 20 tablet 0  . dexlansoprazole (DEXILANT) 60 mg DR capsule Take 1 capsule (60 mg total) by mouth once daily 30 capsule 5  . diclofenac (VOLTAREN) 50 MG EC tablet Take 1 tablet (50 mg total) by mouth 2 (two) times daily with meals 60 tablet 11  . dicyclomine (BENTYL) 10 mg capsule Take 1 capsule (10 mg total) by mouth 4 (four) times daily before meals and nightly As needed 120 capsule 1  . ergocalciferol, vitamin D2, 1,250 mcg (50,000 unit) capsule Take 1 capsule (50,000 Units total) by mouth once a week for 30 days (Patient not taking: Reported on 01/13/2019 ) 4 capsule 0  .  IBUPROFEN ORAL Take by mouth  . meloxicam (MOBIC) 15 MG tablet Take 1 tablet (15 mg total) by mouth once daily 30 tablet 1  . metoprolol succinate (TOPROL-XL) 25 MG XL tablet TAKE ONE TABLET EVERY DAY 90 tablet 3  .  predniSONE (DELTASONE) 10 MG tablet Taper 6,5,4,3,2,1,off 21 tablet 0  . simethicone (GAS-X ORAL) Take by mouth  . SUMAtriptan (IMITREX) 100 MG tablet Take 1 tablet (100 mg total) by mouth once as needed for Migraine May take a second dose after 2 hours if needed. 18 tablet 5  . SYNTHROID 150 mcg tablet Take 1 tablet (150 mcg total) by mouth once daily Take on an empty stomach with a glass of water at least 30-60 minutes before breakfast. 90 tablet 3   No current facility-administered medications for this visit.   Allergies Asacol [mesalamine] and Shellfish containing products  Social and Family History  Social History reports that she has never smoked. She has never used smokeless tobacco. She reports current alcohol use. She reports that she does not use drugs.  Family History family history includes High blood pressure (Hypertension) in her maternal grandfather; Leukemia in her maternal grandmother; Lung cancer in her mother; Lymphoma in her father; Myocardial Infarction (Heart attack) in her mother; Ovarian cancer in her mother; Stroke in her maternal grandfather and mother; Thyroid disease in her father.   Review of Systems   Review of Systems: The patient reports chest pain, with associated shortness of breath, without orthopnea, paroxysmal nocturnal dyspnea, pedal edema, palpitations, heart racing, presyncope, syncope. Review of 12 Systems is negative except as described above.  Physical Examination   Vitals:BP (!) 138/92  Pulse 63  Ht 157.5 cm (5\' 2" )  Wt 88.4 kg (194 lb 12.8 oz)  SpO2 98%  BMI 35.63 kg/m  Ht:157.5 cm (5\' 2" ) Wt:88.4 kg (194 lb 12.8 oz) AVW:UJWJ surface area is 1.97 meters squared. Body mass index is 35.63 kg/m.  HEENT: Pupils equally reactive to light and accomodation  Neck: Supple without thyromegaly, carotid pulses 2+ Lungs: clear to auscultation bilaterally; no wheezes, rales, rhonchi Heart: Regular rate and rhythm. No gallops, murmurs or  rub Abdomen: soft nontender, nondistended, with normal bowel sounds Extremities: no cyanosis, clubbing, or edema Peripheral Pulses: 2+ in all extremities, 2+ femoral pulses bilaterally  Assessment   54 y.o. female with  1. Essential hypertension  2. Chest pain with low risk for cardiac etiology   44-year-old female with recent onset of chest pain, which occurs with and without exertion, with typical and atypical features, with nondiagnostic ECG. Also tested positive for Covid/07/2018. She has essential hypertension, blood pressure mildly elevated on current BP medications.  Plan   1. Continue current medications 2. Counseled patient about low-sodium diet 3. DASH diet printed instructions given to the patient 4. Stress echocardiogram 5. Return to clinic after stress echocardiogram  Orders Placed This Encounter  Procedures  . ECG stress test only  . Echo stress test   Return in about 1 week (around 03/11/2019), or after stress echo.  Melanie Millard, MD PhD [facility]    Electronically signed by Melanie Millard, MD at 03/04/2019 10:20 AM EST  Plan of Treatment - documented as of this encounter Upcoming Encounters Upcoming Encounters  Date Type Specialty Care Team Description  01/18/2020 Post Op Orthopaedics Blanchard Mane [place], [place]  [address]  Raynelle Bring  [place], [place] [address]  [phone] (Work)  [phone] (Fax)    01/18/2020 Office Visit Internal Medicine Sandie Ano, MD  [address] [place], [place] [address]  [phone] (Work)  [phone] (Fax)    02/19/2020 Post Op Orthopaedics Poggi, Thalia Bloodgood, MD  [address] [facility] [address]  [facility] [place], [place] [address]  [phone] (Work)  [phone] (Fax)    Procedures - documented in this encounter Procedure Name Priority Date/Time Associated Diagnosis Comments  ECG  02/24/2019 12:00 AM EST  Results for this procedure are in  the results section.   ECG  12/10/2018 12:00 AM EDT  Results for this procedure are in the results section.   Imaging Results - documented in this encounter  Echo stress test (03/30/2019 10:58 AM EST) Echo stress test (03/30/2019 10:58 AM EST)  Component Value Ref Range Performed At Pathologist Signature  LV Ejection Fraction (%) 55  [facility]   Aortic Valve Regurgitation Grade none  [facility]   Aortic Valve Stenosis Grade none  [facility]   Mitral Valve Regurgitation Grade trivial  [facility]   Mitral Valve Stenosis Grade none  [facility]   Tricuspid Valve Regurgitation Grade mild  [facility]   Tricuspid Valve Regurgitation Max Velocity (m/s) 2.5 m/sec [facility]   Right Ventricle Systolic Pressure (mmHg) 30.5 mmHg [facility]    Echo stress test (03/30/2019 10:58 AM EST)  Anatomical Region Laterality Modality    Ultrasound   Echo stress test (03/30/2019 10:58 AM EST)  Specimen     Echo stress test (03/30/2019 10:58 AM EST)  Narrative  [facility] - 04/03/2019 8:25 AM EST             CARDIOLOGY DEPARTMENT         [facility]                  [id number]     [facility]              Acct #: [id number]     [address] [place], [place] [address]    Date: 03/30/2019 10: 11 AM                               Adult  Female  Age: 64 yrs     ECHOCARDIOGRAM REPORT               Outpatient                               [facility]   STUDY:Stress Echo       TAPE:          MD1: Paraschos, MD Alex   ECHO:Yes  DOPPLER:Yes    FILE:0000-000-000    BP: 155/106 mmHg   COLOR:Yes  CONTRAST:No   MACHINE:Philips RV BIOPSY:No     3D:No SOUND QLTY:Moderate       Height: 62 in  MEDIUM:None                       Weight: 194 lb                               BSA: 1.9 m2 _________________________________________________________________________________________       HISTORY: Chest pain  REASON: Assess, LV function      Indication: R07.9 Chest pain, unspecified _________________________________________________________________________________________ STRESS ECHOCARDIOGRAPHY       Protocol: Treadmill (Bruce)        Drugs: None      Target HR: 143 bpm      Maximum Predicted HR: 168 bpm +----------------------------------------------------------------------------+ :Stage      Duration           HR     BP         : :  RESTING   :              :84    :155/106       : :---------------+----------------------------+----------+---------+     : : EXERCISE   :7:01            :160    :/          : :---------------+----------------------------+----------+---------+     : : RECOVERY   :4:04            :92    :169/105       : :---------------+----------------------------+----------+---------+     : +----------------------------------------------------------------------------+   Stress Duration: 7: 01 mm: ss   Max Stress H.R.: 160 bpm       Target HR Achieved: Yes _________________________________________________________________________________________ WALL SEGMENT CHANGES            Rest        Stress Anterior Septum Basal: Normal       Hyperkinetic         Mid: Normal       Hyperkinetic        Apical: Normal       Hyperkinetic Anterior Wall Basal: Normal       Hyperkinetic         Mid: Normal       Hyperkinetic        Apical: Normal        Hyperkinetic  Lateral Wall Basal: Normal       Hyperkinetic         Mid: Normal       Hyperkinetic        Apical: Normal       Hyperkinetic Posterior Wall Basal: Normal       Hyperkinetic         Mid: Normal       Hyperkinetic Inferior Wall Basal: Normal       Hyperkinetic         Mid: Normal       Hyperkinetic        Apical: Normal       Hyperkinetic Inferior Septum Basal: Normal       Hyperkinetic         Mid: Normal       Hyperkinetic      Resting EF: >55% (Est.)         Stress EF: >55% (Est.)  _________________________________________________________________________________________ ADDITIONAL FINDINGS _________________________________________________________________________________________ STRESS ECG RESULTS     ECG Results: Normal _________________________________________________________________________________________ ECHOCARDIOGRAPHIC DESCRIPTIONS LEFT VENTRICLE         Size: Normal     Contraction: Normal      LV Masses: No Masses         LVH: None RIGHT VENTRICLE         Size: Normal            Free Wall: Normal     Contraction: Normal            RV Masses:  No mass PERICARDIUM        Fluid: No effusion _________________________________________________________________________________________  DOPPLER ECHO and OTHER SPECIAL PROCEDURES        Aortic: No AR           No AS        Mitral: TRIVIAL MR         No MS            MV Inflow E Vel = nm*   MV Annulus E'Vel = nm*            E/E'Ratio = nm*      Tricuspid: MILD TR          No TS            252.5 cm/sec peak TR vel  30.5 mmHg peak RV pressure      Pulmonary: TRIVIAL PR         No  PS _________________________________________________________________________________________ ECHOCARDIOGRAPHIC MEASUREMENTS 2D DIMENSIONS AORTA         Values  Normal Range  MAIN PA     Values  Normal Range       Annulus: nm*   [2.1-2.5]       PA Main: nm*    [1.5-2.1]      Aorta Sin: nm*   [2.7-3.3]  RIGHT VENTRICLE     ST Junction: nm*   [2.3-2.9]       RV Base: nm*    [<4.2]      Asc.Aorta: nm*   [2.3-3.1]        RV Mid: nm*    [<3.5] LEFT VENTRICLE                   RV Length: nm*    [<8.6]        LVIDd: nm*   [3.9-5.3]  INFERIOR VENA CAVA        LVIDs: nm*              Max. IVC: nm*    [<=2.1]          FS: nm*   [>25]         Min. IVC: nm*         SWT: nm*   [0.5-0.9]  ------------------         PWT: nm*   [0.5-0.9]  nm* - not measured LEFT ATRIUM       LA Diam: nm*   [2.7-3.8]     LA A4C Area: nm*   [<20]      LA Volume: nm*   [22-52] _________________________________________________________________________________________ INTERPRETATION Normal Stress Echocardiogram NORMAL RIGHT VENTRICULAR SYSTOLIC FUNCTION MILD VALVULAR REGURGITATION (See above) NO VALVULAR STENOSIS NOTED _________________________________________________________________________________________ Electronically signed by   MD Jamse Mead on 04/03/2019 08: 25 AM     Performed By: Luretha Murphy, RDCS, RVT  Ordering Physician: Darrold Junker, MD Alex _________________________________________________________________________________________     Echo stress test (03/30/2019 10:58 AM EST)  Procedure Note  Melanie Millard, MD - 04/03/2019  Formatting of this note might be different from the original. CARDIOLOGY DEPARTMENT SARAYA, TIREY [facility] [id number] [facility] Acct  #: [id number] [address] [place], [place] [address] Date: 03/30/2019 10: 11 AM Adult Female Age: 60 yrs ECHOCARDIOGRAM REPORT Outpatient [facility] STUDY:Stress Echo TAPE: MD1: Paraschos, MD Alex ECHO:Yes DOPPLER:Yes FILE:0000-000-000 BP: 155/106 mmHg COLOR:Yes CONTRAST:No MACHINE:Philips RV BIOPSY:No 3D:No SOUND QLTY:Moderate Height: 62 in MEDIUM:None Weight: 194 lb BSA: 1.9 m2 _________________________________________________________________________________________ HISTORY: Chest pain REASON: Assess, LV function Indication: R07.9  Chest pain, unspecified _________________________________________________________________________________________ STRESS ECHOCARDIOGRAPHY Protocol: Treadmill (Bruce) Drugs: None Target HR: 143 bpm Maximum Predicted HR: 168 bpm +----------------------------------------------------------------------------+ :Stage Duration HR BP  : : RESTING : :84 :155/106  : :---------------+----------------------------+----------+---------+  : : EXERCISE :7:01 :160 :/  : :---------------+----------------------------+----------+---------+  : : RECOVERY :4:04 :92 :169/105  : :---------------+----------------------------+----------+---------+  : +----------------------------------------------------------------------------+ Stress Duration: 7: 01 mm: ss Max Stress H.R.: 160 bpm Target HR Achieved: Yes _________________________________________________________________________________________ WALL SEGMENT CHANGES Rest Stress Anterior Septum Basal: Normal Hyperkinetic Mid: Normal Hyperkinetic Apical: Normal Hyperkinetic Anterior Wall Basal: Normal Hyperkinetic Mid: Normal Hyperkinetic Apical: Normal Hyperkinetic Lateral Wall Basal: Normal Hyperkinetic Mid: Normal Hyperkinetic Apical: Normal Hyperkinetic Posterior Wall Basal: Normal Hyperkinetic Mid: Normal Hyperkinetic Inferior Wall Basal: Normal Hyperkinetic Mid: Normal Hyperkinetic Apical: Normal  Hyperkinetic Inferior Septum Basal: Normal Hyperkinetic Mid: Normal Hyperkinetic Resting EF: >55% (Est.) Stress EF: >55% (Est.)  _________________________________________________________________________________________ ADDITIONAL FINDINGS _________________________________________________________________________________________ STRESS ECG RESULTS ECG Results: Normal _________________________________________________________________________________________ ECHOCARDIOGRAPHIC DESCRIPTIONS LEFT VENTRICLE Size: Normal Contraction: Normal LV Masses: No Masses LVH: None RIGHT VENTRICLE Size: Normal Free Wall: Normal Contraction: Normal RV Masses: No mass PERICARDIUM Fluid: No effusion _________________________________________________________________________________________  DOPPLER ECHO and OTHER SPECIAL PROCEDURES Aortic: No AR No AS Mitral: TRIVIAL MR No MS MV Inflow E Vel = nm* MV Annulus E'Vel = nm* E/E'Ratio = nm* Tricuspid: MILD TR No TS 252.5 cm/sec peak TR vel 30.5 mmHg peak RV pressure Pulmonary: TRIVIAL PR No PS _________________________________________________________________________________________ ECHOCARDIOGRAPHIC MEASUREMENTS 2D DIMENSIONS AORTA Values Normal Range MAIN PA Values Normal Range Annulus: nm* [2.1-2.5] PA Main: nm* [1.5-2.1] Aorta Sin: nm* [2.7-3.3] RIGHT VENTRICLE ST Junction: nm* [2.3-2.9] RV Base: nm* [<4.2] Asc.Aorta: nm* [2.3-3.1] RV Mid: nm* [<3.5] LEFT VENTRICLE RV Length: nm* [<8.6] LVIDd: nm* [3.9-5.3] INFERIOR VENA CAVA LVIDs: nm* Max. IVC: nm* [<=2.1] FS: nm* [>25] Min. IVC: nm* SWT: nm* [0.5-0.9] ------------------ PWT: nm* [0.5-0.9] nm* - not measured LEFT ATRIUM LA Diam: nm* [2.7-3.8] LA A4C Area: nm* [<20] LA Volume: nm* [22-52] _________________________________________________________________________________________ INTERPRETATION Normal Stress Echocardiogram NORMAL RIGHT VENTRICULAR SYSTOLIC FUNCTION MILD VALVULAR REGURGITATION  (See above) NO VALVULAR STENOSIS NOTED _________________________________________________________________________________________ Electronically signed by MD Jamse Mead on 04/03/2019 08: 25 AM Performed By: Luretha Murphy, RDCS, RVT Ordering Physician: Darrold Junker, MD Alex _________________________________________________________________________________________     Echo stress test (03/30/2019 10:58 AM EST)  Performing Organization Address City/State/ZIP Code Phone Number  [facility]        ECG Results - documented in this encounter Table of Contents for ECG Results  ECG stress test only (03/30/2019 11:00 AM EST)  ECG (02/24/2019 12:00 AM EST)  ECG (12/10/2018 12:00 AM EDT)     ECG stress test only (03/30/2019 11:00 AM EST) ECG stress test only (03/30/2019 11:00 AM EST)  Narrative  This result has an attachment that is not available.      ECG stress test only (03/30/2019 11:00 AM EST)  Performing Organization Address City/State/ZIP Code Phone Number  [facility]       Back to top of ECG Results    ECG (02/24/2019 12:00 AM EST) ECG (02/24/2019 12:00 AM EST)  Narrative  02/24/2019 12:00 AM EST  This result has an attachment that is not available.  Ordered by an unspecified provider.   Back to top of ECG Results    ECG (12/10/2018 12:00 AM EDT) ECG (12/10/2018 12:00 AM EDT)  Narrative  12/10/2018 12:00 AM EDT  This result has an attachment that is not available.  Ordered by an unspecified provider.   Back to top of ECG  Results Visit Diagnoses - documented in this encounter Diagnosis  Essential hypertension - Primary   Chest pain with low risk for cardiac etiology   Care Teams - documented as of this encounter Team Member Relationship Specialty Start Date End Date  Sandie Ano, MD  [address] [place], [place] [address]  [phone] (Work)  [phone] (Fax)  PCP - General Family Medicine  01/29/17   Images Patient Demographics  Patient Address Communication Language Race / Ethnicity Marital Status  [address] [place]) [place], [place] [address]  Former (Jun. 18, 2018 - Apr. 29, 2019): [address] [place]) [place], [place] [address] [phone] [facility]) [phone] (Home) [phone] (Work) [email or web address] English (Preferred) [place] A[place] / Not Hispanic or Latino Married  Patient Contacts  Contact Name Contact Address Communication Relationship to Patient  Melanie Beltran [address] [place], [place] [address] [phone] [facility]) [phone] (Home) Spouse, Emergency Contact  Document Information  Primary Care Provider Other Service Providers Document Coverage Dates  Sandie Ano, MD (Dec. 18, 2018December 18, 2018 - Present) [phone] (Work) [phone] (Fax) [phone] [facility] [place], [place] [address] Family Medicine [facility] [address] [place], [place] [address]  Jan. 20, 2021January 20, 2021   Custodian Organization  [facility] [address] [place], [place] [address]   Encounter Providers Encounter Date  Melanie Millard, MD (Attending) [phone] (Work) [phone] (Fax) [address] [facility] [place], [place] [address] Cardiovascular Disease Jan. 20, 2021January 20, 2021   Legal Authenticator   Nino Glow    Show All Sections

## 2019-12-31 NOTE — Patient Instructions (Addendum)
Your procedure is scheduled on:01-06-20 [facility] Report to the Registration Desk on the 1st floor of the [facility]. To find out your arrival time, please call [phone] between 1PM - 3PM on:01-05-20 TUESDAY  REMEMBER: Instructions that are not followed completely may result in serious medical risk, up to and including death; or upon the discretion of your surgeon and anesthesiologist your surgery may need to be rescheduled.  Do not eat food after midnight the night before surgery.  No gum chewing, lozengers or hard candies.  You may however, drink CLEAR liquids up to 2 hours before you are scheduled to arrive for your surgery. Do not drink anything within 2 hours of your scheduled arrival time.  Clear liquids include: - water  - apple juice without pulp - gatorade (not RED, PURPLE, OR BLUE) - black coffee or tea (Do NOT add milk or creamers to the coffee or tea) Do NOT drink anything that is not on this list.  In addition, your doctor has ordered for you to drink the provided  Ensure Pre-Surgery Clear Carbohydrate Drink  Drinking this carbohydrate drink up to two hours before surgery helps to reduce insulin resistance and improve patient outcomes. Please complete drinking 2 hours prior to scheduled arrival time.  TAKE THESE MEDICATIONS THE MORNING OF SURGERY WITH A SIP OF WATER: -SYNTHROID (LEVOTHYROXINE) -GABAPENTIN (NEURONTIN) -DEXILANT (take one the night before and one on the morning of surgery - helps to prevent nausea after surgery.)  One week prior to surgery: Stop Anti-inflammatories (NSAIDS) such as Advil, Aleve, Ibuprofen, Motrin, Naproxen, Naprosyn and Aspirin based products such as Excedrin, Goodys Powder, BC Powder-OK TO TAKE TYLENOL IF NEEDED  Stop ANY OVER THE COUNTER supplements until after surgery  No Alcohol for 24 hours before or after surgery.  No Smoking including e-cigarettes for 24 hours prior to surgery.  No chewable tobacco products for at least 6  hours prior to surgery.  No nicotine patches on the day of surgery.  Do not use any "recreational" drugs for at least a week prior to your surgery.  Please be advised that the combination of cocaine and anesthesia may have negative outcomes, up to and including death. If you test positive for cocaine, your surgery will be cancelled.  On the morning of surgery brush your teeth with toothpaste and water, you may rinse your mouth with mouthwash if you wish. Do not swallow any toothpaste or mouthwash.  Do not wear jewelry, make-up, hairpins, clips or nail polish.  Do not wear lotions, powders, or perfumes.   Do not shave body from the neck down 48 hours prior to surgery just in case you cut yourself which could leave a site for infection.  Also, freshly shaved skin may become irritated if using the CHG soap.  Contact lenses, hearing aids and dentures may not be worn into surgery.  Do not bring valuables to the hospital. [facility] is not responsible for any missing/lost belongings or valuables.   Use CHG Soap as directed on instruction sheet  Notify your doctor if there is any change in your medical condition (cold, fever, infection).  Wear comfortable clothing (specific to your surgery type) to the hospital.  Plan for stool softeners for home use; pain medications have a tendency to cause constipation. You can also help prevent constipation by eating foods high in fiber such as fruits and vegetables and drinking plenty of fluids as your diet allows.  After surgery, you can help prevent lung complications by doing breathing  exercises.  Take deep breaths and cough every 1-2 hours. Your doctor may order a device called an Incentive Spirometer to help you take deep breaths. When coughing or sneezing, hold a pillow firmly against your incision with both hands. This is called "splinting." Doing this helps protect your incision. It also decreases belly discomfort.  If you are being admitted  to the hospital overnight, leave your suitcase in the car. After surgery it may be brought to your room.  If you are being discharged the day of surgery, you will not be allowed to drive home. You will need a responsible adult (18 years or older) to drive you home and stay with you that night.   If you are taking public transportation, you will need to have a responsible adult (18 years or older) with you. Please confirm with your physician that it is acceptable to use public transportation.   Please call the Pre-admissions Testing Dept. at [phone] if you have any questions about these instructions.  Visitation Policy:  Patients undergoing a surgery or procedure may have one family member or support person with them as long as that person is not COVID-19 positive or experiencing its symptoms.  That person may remain in the waiting area during the procedure.  Inpatient Visitation Update:   In an effort to ensure the safety of our team members and our patients, we are implementing a change to our visitation policy:  Effective Monday, Aug. 9, at 7 a.m., inpatients will be allowed one support person.  o The support person may change daily.  o The support person must pass our screening, gel in and out, and wear a mask at all times, including in the patient's room.  o Patients must also wear a mask when staff or their support person are in the room.  o Masking is required regardless of vaccination status.  Systemwide, no visitors 17 or younger.

## 2019-12-31 NOTE — Pre-Procedure Instructions (Signed)
Echo stress test   Ref Range & Units 9 mo ago  LV Ejection Fraction (%)  55      Aortic Valve Regurgitation Grade  none      Aortic Valve Stenosis Grade  none      Mitral Valve Regurgitation Grade  trivial      Mitral Valve Stenosis Grade  none      Tricuspid Valve Regurgitation Grade  mild      Tricuspid Valve Regurgitation Max Velocity (m/s) m/sec 2.5      Right Ventricle Systolic Pressure (mmHg) mmHg 02.7      Narrative             CARDIOLOGY DEPARTMENT         [facility]                  [id number]      [facility]              Acct #: [id number]      [address] [place] [address]    Date: 03/30/2019 10: 11 AM                                Adult  Female  Age: 54 yrs      ECHOCARDIOGRAM REPORT               Outpatient                                [facility]    STUDY:Stress Echo       TAPE:          MD1: Paraschos, MD Alex    ECHO:Yes  DOPPLER:Yes    FILE:0000-000-000    BP: 155/106 mmHg    COLOR:Yes  CONTRAST:No   MACHINE:Philips  RV BIOPSY:No     3D:No SOUND QLTY:Moderate      Height: 62 in   MEDIUM:None                       Weight: 194 lb                                BSA: 1.9 m2  _________________________________________________________________________________________        HISTORY: Chest pain         REASON: Assess, LV function       Indication: R07.9 Chest pain, unspecified  _________________________________________________________________________________________  STRESS ECHOCARDIOGRAPHY        Protocol: Treadmill (Bruce)         Drugs: None       Target HR: 143 bpm      Maximum Predicted HR:  168 bpm  +----------------------------------------------------------------------------+  :Stage      Duration           HR     BP         :  :  RESTING   :              :84    :155/106       :  :---------------+----------------------------+----------+---------+     :  : EXERCISE   :7:01            :160    :/          :  :---------------+----------------------------+----------+---------+     :  :  RECOVERY   :4:04            :92    :169/105       :  :---------------+----------------------------+----------+---------+     :  +----------------------------------------------------------------------------+    Stress Duration: 7: 01 mm: ss    Max Stress H.R.: 160 bpm       Target HR Achieved: Yes  _________________________________________________________________________________________  WALL SEGMENT CHANGES             Rest        Stress  Anterior Septum Basal: Normal       Hyperkinetic          Mid: Normal       Hyperkinetic         Apical: Normal       Hyperkinetic  Anterior Wall Basal: Normal       Hyperkinetic          Mid: Normal       Hyperkinetic         Apical: Normal      Hyperkinetic   Lateral Wall Basal: Normal       Hyperkinetic          Mid: Normal       Hyperkinetic         Apical: Normal       Hyperkinetic  Posterior Wall Basal: Normal       Hyperkinetic         Mid: Normal       Hyperkinetic  Inferior Wall Basal: Normal       Hyperkinetic          Mid: Normal       Hyperkinetic         Apical: Normal       Hyperkinetic  Inferior Septum Basal: Normal       Hyperkinetic          Mid: Normal       Hyperkinetic        Resting EF: >55% (Est.)         Stress EF: >55% (Est.)   _________________________________________________________________________________________  ADDITIONAL FINDINGS  _________________________________________________________________________________________  STRESS ECG RESULTS      ECG Results: Normal  _________________________________________________________________________________________  ECHOCARDIOGRAPHIC DESCRIPTIONS  LEFT VENTRICLE          Size: Normal      Contraction: Normal       LV Masses: No Masses          LVH: None  RIGHT VENTRICLE          Size: Normal            Free Wall: Normal      Contraction: Normal            RV Masses: No mass  PERICARDIUM         Fluid: No effusion  _________________________________________________________________________________________   DOPPLER ECHO and OTHER SPECIAL PROCEDURES         Aortic: No AR           No AS         Mitral: TRIVIAL MR         No MS             MV Inflow E Vel = nm*   MV Annulus E'Vel = nm*             E/E'Ratio = nm*       Tricuspid: MILD TR  No TS             252.5 cm/sec peak TR vel  30.5 mmHg peak RV pressure       Pulmonary: TRIVIAL PR         No PS  _________________________________________________________________________________________  ECHOCARDIOGRAPHIC MEASUREMENTS  2D DIMENSIONS  AORTA         Values  Normal Range  MAIN PA     Values  Normal Range        Annulus: nm*   [2.1-2.5]       PA Main: nm*    [1.5-2.1]       Aorta Sin: nm*  [2.7-3.3]  RIGHT VENTRICLE      ST Junction: nm*   [2.3-2.9]       RV Base: nm*    [<4.2]       Asc.Aorta: nm*   [2.3-3.1]        RV Mid: nm*    [<3.5]  LEFT VENTRICLE                    RV Length: nm*    [<8.6]         LVIDd: nm*   [3.9-5.3]  INFERIOR VENA CAVA         LVIDs: nm*              Max. IVC: nm*    [<=2.1]           FS: nm*   [>25]         Min. IVC: nm*          SWT: nm*   [0.5-0.9]  ------------------          PWT: nm*   [0.5-0.9]  nm* - not measured  LEFT ATRIUM        LA Diam: nm*   [2.7-3.8]      LA A4C Area: nm*   [<20]       LA Volume: nm*   [22-52]  _________________________________________________________________________________________  INTERPRETATION  Normal Stress Echocardiogram  NORMAL RIGHT VENTRICULAR SYSTOLIC FUNCTION  MILD VALVULAR REGURGITATION (See above)  NO VALVULAR STENOSIS NOTED  _________________________________________________________________________________________  Electronically signed by   MD Jamse Mead on 04/03/2019 08: 25 AM      Performed By: Mathis Bud, RVT   Ordering Physician: Darrold Junker, MD Alex  _________________________________________________________________________________________ Other Result Text  Interface, Text Results In - 04/03/2019 8:26 AM EST             CARDIOLOGY DEPARTMENT         [facility]                  [id number]       [facility]             Acct #: [id number]       [address], [place], [place] [address]    Date: 03/30/2019 10: 11 AM                                Adult  Female  Age: 89 yrs       ECHOCARDIOGRAM REPORT               Outpatient  KC^^[facility]WC    STUDY:Stress Echo       TAPE:          MD1: Paraschos, MD Alex     ECHO:Yes  DOPPLER:Yes    FILE:0000-000-000    BP: 155/106 mmHg     COLOR:Yes  CONTRAST:No   MACHINE:Philips  RV BIOPSY:No     3D:No SOUND QLTY:Moderate      Height: 62 in    MEDIUM:None                       Weight: 194 lb                                BSA: 1.9 m2  _________________________________________________________________________________________         HISTORY: Chest pain         REASON: Assess, LV function       Indication: R07.9 Chest pain, unspecified  _________________________________________________________________________________________  STRESS ECHOCARDIOGRAPHY        Protocol: Treadmill (Bruce)          Drugs: None        Target HR: 143 bpm      Maximum Predicted HR: 168 bpm  +----------------------------------------------------------------------------+  :Stage      Duration           HR     BP         :  :  RESTING   :              :84    :155/106       :  :---------------+----------------------------+----------+---------+     :  : EXERCISE   :7:01            :160    :/          :  :---------------+----------------------------+----------+---------+     :  : RECOVERY   :4:04            :92    :169/105       :  :---------------+----------------------------+----------+---------+     :  +----------------------------------------------------------------------------+     Stress Duration: 7: 01 mm: ss     Max Stress H.R.: 160 bpm       Target HR Achieved: Yes  _________________________________________________________________________________________  WALL SEGMENT CHANGES             Rest        Stress  Anterior Septum Basal: Normal       Hyperkinetic           Mid: Normal       Hyperkinetic         Apical: Normal       Hyperkinetic    Anterior Wall Basal: Normal       Hyperkinetic           Mid: Normal       Hyperkinetic         Apical: Normal       Hyperkinetic   Lateral Wall Basal: Normal       Hyperkinetic           Mid: Normal       Hyperkinetic         Apical: Normal       Hyperkinetic  Posterior Wall Basal: Normal       Hyperkinetic           Mid: Normal  Hyperkinetic   Inferior Wall Basal: Normal       Hyperkinetic           Mid: Normal       Hyperkinetic         Apical: Normal      Hyperkinetic  Inferior Septum Basal: Normal       Hyperkinetic           Mid: Normal       Hyperkinetic       Resting EF: >55% (Est.)         Stress EF: >55% (Est.)   _________________________________________________________________________________________  ADDITIONAL FINDINGS  _________________________________________________________________________________________  STRESS ECG RESULTS       ECG Results: Normal  _________________________________________________________________________________________  ECHOCARDIOGRAPHIC DESCRIPTIONS  LEFT VENTRICLE          Size: Normal       Contraction: Normal        LV Masses: No Masses           LVH: None  RIGHT VENTRICLE         Size: Normal            Free Wall: Normal       Contraction: Normal            RV Masses: No mass  PERICARDIUM          Fluid: No effusion  _________________________________________________________________________________________   DOPPLER ECHO and OTHER SPECIAL PROCEDURES         Aortic: No AR           No AS         Mitral: TRIVIAL MR         No MS             MV Inflow E Vel = nm*   MV Annulus E'Vel = nm*            E/E'Ratio = nm*         Tricuspid: MILD TR          No TS             252.5 cm/sec peak TR vel  30.5 mmHg peak RV pressure        Pulmonary: TRIVIAL PR         No PS  _________________________________________________________________________________________  ECHOCARDIOGRAPHIC MEASUREMENTS  2D DIMENSIONS  AORTA         Values  Normal Range  MAIN PA     Values  Normal Range         Annulus: nm*   [2.1-2.5]       PA Main: nm*    [1.5-2.1]        Aorta Sin: nm*   [2.7-3.3]  RIGHT VENTRICLE       ST Junction: nm*   [2.3-2.9]       RV Base: nm*    [<4.2]        Asc.Aorta: nm*   [2.3-3.1]        RV Mid: nm*    [<3.5]  LEFT VENTRICLE                   RV Length: nm*    [<8.6]          LVIDd: nm*   [3.9-5.3]  INFERIOR VENA CAVA          LVIDs: nm*              Max. IVC: nm*    [<=2.1]  FS: nm*   [>25]         Min. IVC: nm*           SWT: nm*   [0.5-0.9]  ------------------           PWT: nm*   [0.5-0.9]  nm* - not measured  LEFT ATRIUM         LA Diam: nm*   [2.7-3.8]       LA A4C Area: nm*   [<20]        LA Volume: nm*   [22-52]  _________________________________________________________________________________________  INTERPRETATION  Normal Stress Echocardiogram  NORMAL RIGHT VENTRICULAR SYSTOLIC FUNCTION  MILD VALVULAR REGURGITATION (See above)  NO VALVULAR STENOSIS NOTED  _________________________________________________________________________________________  Electronically signed by   MD Jamse MeadAlex Paraschos on 04/03/2019 08: 25 AM      Performed By: Mathis BudMartin, Matthew, RDCS, RVT   Ordering Physician: Darrold JunkerParaschos, MD Alex  _________________________________________________________________________________________ Specimen Collected: --  Date:  04/03/19  [facility]   Encounter Summary

## 2020-01-01 ENCOUNTER — Encounter: Payer: Self-pay | Admitting: Surgery

## 2020-01-01 NOTE — Progress Notes (Signed)
[facility] Perioperative Services  Pre-Admission/Anesthesia Testing Clinical Review  Date: 01/01/20  Patient Demographics:  Name: Melanie Beltran DOB:   [birth year] MRN:   [id number]  Planned Surgical Procedure(s):    Case: [id number] Date/Time: 01/06/20 0908   Procedure: KNEE ARTHROSCOPY WITH DEBRIDEMENT AND PARTIAL LATERAL MENISCECTOMY. (Right Knee)   Anesthesia type: Choice   Pre-op diagnosis:      Primary osteoarthritis of right knee M17.11     Complex tear of lateral meniscus of right knee as current injury, subsequent encounter S83.271D   Location: [facility] OR ROOM 02 / [facility]S [facility]   Surgeons: Christena Flake, MD     NOTE: Available PAT nursing documentation and vital signs have been reviewed. Clinical nursing staff has updated patient's PMH/PSHx, current medication list, and drug allergies/intolerances to ensure comprehensive history available to assist in medical decision making as it pertains to the aforementioned surgical procedure and anticipated anesthetic course.   Clinical Discussion:  Melanie Beltran is a 54 y.o. female who is submitted for pre-surgical anesthesia review and clearance prior to her undergoing the above procedure. Patient has never been a smoker. Pertinent PMH includes: chest pain with low risk of cardiac etiology, HTN, hypothyroidism, GERD (on daily PPI), fibromyalgia, OA.  Patient is followed by cardiology Darrold Junker, MD). She was last seen in the cardiology clinic on 04/03/2019; notes reviewed.  At the time of her clinic visit, patient complained of nonexertional chest pain.  She denied shortness of breath, PND, orthopnea, palpitations, vertiginous symptoms, peripheral edema, and presyncope/syncope.  Patient seen at urgent care and 01/2019 with complaints of cough and pleuritic chest pain; SARS-CoV-2 testing at that time was (+).  Patient was then seen in the ED at [facility] on 02/24/2019 with complaints of left-sided pleuritic  chest pain; work-up negative for ACS.  Event monitor study performed and 02/2019 revealing a predominant sinus rhythm with occasional PACs.  Stress echocardiogram performed in 03/2019 produced left-sided chest discomfort during the study, however there were no ischemic changes noted.  Last TTE revealed normal left ventricular systolic function; LVEF >55% (see full interpretation of cardiovascular testing below).  Patient is on GDMT for her hypertension using beta-blocker monotherapy; blood pressure well controlled in the office at 122/80.  In review of the diagnostic testing, cardiology elected to defer cardiac catheterization.  No changes were made to her medication regimen.  Patient follow-up with outpatient cardiology in 3 months.  Patient scheduled undergo elective knee arthroscopy with debridement and partial lateral meniscectomy on 01/06/2020 with Dr. Leron Croak.  In review of patient's past medical history, it was determined that no presurgical cardiac clearance was necessary prior to her case.  Patient is not on any sort of anticoagulation medicines.  She reports previous perioperative complications with anesthesia. Patient with a PMH (+) of PONV. She underwent a general anesthetic course here (ASA III) in 04/2018 with no documented complications.   Vitals with BMI 02/24/2019 02/24/2019 01/18/2019  Height - 5\' 2"  5\' 2"   Weight - 190 lbs 190 lbs  BMI - 34.74 34.74  Systolic 146 164  Diastolic 104 105 90  Pulse 74 83 79    Providers/Specialists:   NOTE: Primary physician provider listed below. Patient may have been seen by APP or partner within same practice.   PROVIDER ROLE LAST OV  Poggi, , MD Orthopedics (Surgeon)  12/14/2019  Excell Seltzer, MD Primary Care Provider  12/08/2019  Jerl Mina, MD Cardiology  04/03/2019   Allergies:  Mesalamine and Shellfish allergy  Current Home Medications:   No current facility-administered medications for this encounter.   Marland Kitchen  DEXILANT 60 MG capsule  . gabapentin (NEURONTIN) 100 MG capsule  . levothyroxine (SYNTHROID) 175 MCG tablet  . metoprolol succinate (TOPROL-XL) 50 MG 24 hr tablet  . AIMOVIG 70 MG/ML SOAJ  . chlorpheniramine-HYDROcodone (TUSSIONEX PENNKINETIC ER) 10-8 MG/5ML SUER  . cholecalciferol (VITAMIN D3) 25 MCG (1000 UT) tablet  . PARoxetine (PAXIL) 30 MG tablet   History:   Past Medical History:  Diagnosis Date  . Arthritis   . Fibromyalgia   . GERD (gastroesophageal reflux disease)   . History of 2019 novel coronavirus disease (COVID-19) 01/18/2019  . Hypertension   . Hypothyroidism   . PONV (postoperative nausea and vomiting)   . Shingles 12/2019   NEVER HAD ANY BLISTERS-HAD NERVE PAIN ONLY    Past Surgical History:  Procedure Laterality Date  . ABDOMINAL HYSTERECTOMY  1995  . AUGMENTATION MAMMAPLASTY  2004/2018  . BREAST ENHANCEMENT SURGERY    . EYE SURGERY     cataracts  . LAPAROSCOPY N/A 04/14/2018   Procedure: LAPAROSCOPY OPERATIVE with biopsies;  Surgeon: Hildred Laser, MD;  Location: [facility] ORS;  Service: Gynecology;  Laterality: N/A;  . LEFT OOPHORECTOMY    . NASAL SINUS SURGERY    . REDUCTION MAMMAPLASTY Bilateral 2018   Family History  Problem Relation Age of Onset  . Cancer Mother   . Non-Hodgkin's lymphoma Father   . Breast cancer Neg Hx   . Ovarian cancer Neg Hx   . Colon cancer Neg Hx    Social History   Tobacco Use  . Smoking status: Never Smoker  . Smokeless tobacco: Never Used  Vaping Use  . Vaping Use: Never used  Substance Use Topics  . Alcohol use: Yes    Comment: occasional    . Drug use: Never    Pertinent Clinical Results:  LABS: Labs reviewed: Acceptable for surgery.        ECG: Date: 12/02/2019 Rate: 83 bpm Rhythm: normal sinus Axis (leads I and aVF): Normal Intervals: PR 156 ms. QRS 90 ms. QTc 425 ms. ST segment and T wave changes: No evidence of acute ST segment elevation or depression Comparison: Similar to previous tracing  obtained on 02/24/2019 NOTE: Tracing obtained at [facility]; unable for review. Above based on cardiologist's interpretation.    IMAGING / PROCEDURES: ECHOCARDIOGRAM STRESS TEST done on 04/03/2019 1. LVEF >55% 2. Normal right ventricular systolic function 3. Mild TR; trivial MR and PR; no AR 4. No valvular stenosis 5. Normal stress echocardiogram   Impression and Plan:  Melanie Beltran has been referred for pre-anesthesia review and clearance prior to her undergoing the planned anesthetic and procedural courses. Available labs, pertinent testing, and imaging results were personally reviewed by me. This patient has been seen in the past by cardiology and has undergone ischemic work-up that resulted as negative.  Primary cardiologist (Paraschos, MD) felt as if chest pain is related to noncardiac origin.  Blood pressure has been well controlled; last documented at 120/84 on 12/14/2019.  Given negative cardiac work-up and controlled hypertension, no cardiac clearance is felt to be warranted at this time.  Based on clinical review performed today (01/01/20), barring any significant acute changes in the patient's overall condition, it is anticipated that she will be able to proceed with the planned surgical intervention. Any acute changes in clinical condition may necessitate her procedure being postponed and/or cancelled. Pre-surgical instructions were reviewed with the  patient during her PAT appointment and questions were fielded by PAT clinical staff.  Quentin Mulling, MSN, APRN, FNP-C, CEN [facility]  Peri-operative Services Nurse Practitioner Phone: [phone] 01/01/20 8:25 AM  NOTE: This note has been prepared using Dragon dictation software. Despite my best ability to proofread, there is always the potential that unintentional transcriptional errors may still occur from this process.

## 2020-01-04 ENCOUNTER — Other Ambulatory Visit: Payer: Self-pay

## 2020-01-04 ENCOUNTER — Other Ambulatory Visit
Admission: RE | Admit: 2020-01-04 | Discharge: 2020-01-04 | Disposition: A | Discharge status: Home / Self Care | Payer: BC Managed Care – PPO | Source: Ambulatory Visit | Attending: Surgery | Admitting: Surgery

## 2020-01-04 DIAGNOSIS — X58XXXA Exposure to other specified factors, initial encounter: Secondary | ICD-10-CM | POA: Diagnosis not present

## 2020-01-04 DIAGNOSIS — K219 Gastro-esophageal reflux disease without esophagitis: Secondary | ICD-10-CM | POA: Diagnosis not present

## 2020-01-04 DIAGNOSIS — M1711 Unilateral primary osteoarthritis, right knee: Secondary | ICD-10-CM | POA: Diagnosis not present

## 2020-01-04 DIAGNOSIS — M94261 Chondromalacia, right knee: Secondary | ICD-10-CM | POA: Diagnosis not present

## 2020-01-04 DIAGNOSIS — Z01812 Encounter for preprocedural laboratory examination: Secondary | ICD-10-CM | POA: Insufficient documentation

## 2020-01-04 DIAGNOSIS — M797 Fibromyalgia: Secondary | ICD-10-CM | POA: Diagnosis not present

## 2020-01-04 DIAGNOSIS — M6751 Plica syndrome, right knee: Secondary | ICD-10-CM | POA: Diagnosis not present

## 2020-01-04 DIAGNOSIS — E039 Hypothyroidism, unspecified: Secondary | ICD-10-CM | POA: Diagnosis not present

## 2020-01-04 DIAGNOSIS — I10 Essential (primary) hypertension: Secondary | ICD-10-CM | POA: Diagnosis not present

## 2020-01-04 DIAGNOSIS — Z8616 Personal history of COVID-19: Secondary | ICD-10-CM | POA: Diagnosis not present

## 2020-01-04 DIAGNOSIS — Z79899 Other long term (current) drug therapy: Secondary | ICD-10-CM | POA: Diagnosis not present

## 2020-01-04 DIAGNOSIS — Z20822 Contact with and (suspected) exposure to covid-19: Secondary | ICD-10-CM | POA: Insufficient documentation

## 2020-01-04 DIAGNOSIS — Z791 Long term (current) use of non-steroidal anti-inflammatories (NSAID): Secondary | ICD-10-CM | POA: Diagnosis not present

## 2020-01-04 DIAGNOSIS — S83231A Complex tear of medial meniscus, current injury, right knee, initial encounter: Secondary | ICD-10-CM | POA: Diagnosis not present

## 2020-01-04 DIAGNOSIS — Z79890 Hormone replacement therapy (postmenopausal): Secondary | ICD-10-CM | POA: Diagnosis not present

## 2020-01-04 LAB — SARS CORONAVIRUS 2 (TAT 6-24 HRS): SARS Coronavirus 2: NEGATIVE

## 2020-01-06 ENCOUNTER — Other Ambulatory Visit: Payer: Self-pay

## 2020-01-06 ENCOUNTER — Ambulatory Visit: Payer: BC Managed Care – PPO | Admitting: Urgent Care

## 2020-01-06 ENCOUNTER — Ambulatory Visit
Admission: RE | Admit: 2020-01-06 | Discharge: 2020-01-06 | Disposition: A | Discharge status: Home / Self Care | Payer: BC Managed Care – PPO | Attending: Surgery | Admitting: Surgery

## 2020-01-06 ENCOUNTER — Encounter
Admission: RE | Disposition: A | Discharge status: Home / Self Care | Payer: Self-pay | Source: Home / Self Care | Attending: Surgery

## 2020-01-06 ENCOUNTER — Encounter: Payer: Self-pay | Admitting: Surgery

## 2020-01-06 DIAGNOSIS — S83231A Complex tear of medial meniscus, current injury, right knee, initial encounter: Secondary | ICD-10-CM | POA: Insufficient documentation

## 2020-01-06 DIAGNOSIS — E039 Hypothyroidism, unspecified: Secondary | ICD-10-CM | POA: Insufficient documentation

## 2020-01-06 DIAGNOSIS — M797 Fibromyalgia: Secondary | ICD-10-CM | POA: Insufficient documentation

## 2020-01-06 DIAGNOSIS — X58XXXA Exposure to other specified factors, initial encounter: Secondary | ICD-10-CM | POA: Insufficient documentation

## 2020-01-06 DIAGNOSIS — M94261 Chondromalacia, right knee: Secondary | ICD-10-CM | POA: Insufficient documentation

## 2020-01-06 DIAGNOSIS — K219 Gastro-esophageal reflux disease without esophagitis: Secondary | ICD-10-CM | POA: Insufficient documentation

## 2020-01-06 DIAGNOSIS — I10 Essential (primary) hypertension: Secondary | ICD-10-CM | POA: Insufficient documentation

## 2020-01-06 DIAGNOSIS — Z79899 Other long term (current) drug therapy: Secondary | ICD-10-CM | POA: Insufficient documentation

## 2020-01-06 DIAGNOSIS — Z791 Long term (current) use of non-steroidal anti-inflammatories (NSAID): Secondary | ICD-10-CM | POA: Insufficient documentation

## 2020-01-06 DIAGNOSIS — M1711 Unilateral primary osteoarthritis, right knee: Secondary | ICD-10-CM | POA: Insufficient documentation

## 2020-01-06 DIAGNOSIS — M6751 Plica syndrome, right knee: Secondary | ICD-10-CM | POA: Insufficient documentation

## 2020-01-06 DIAGNOSIS — Z79890 Hormone replacement therapy (postmenopausal): Secondary | ICD-10-CM | POA: Insufficient documentation

## 2020-01-06 DIAGNOSIS — Z8616 Personal history of COVID-19: Secondary | ICD-10-CM | POA: Insufficient documentation

## 2020-01-06 HISTORY — PX: KNEE ARTHROSCOPY WITH LATERAL MENISECTOMY: SHX6193

## 2020-01-06 HISTORY — DX: Chest pain, unspecified: R07.9

## 2020-01-06 SURGERY — ARTHROSCOPY, KNEE, WITH LATERAL MENISCECTOMY
Anesthesia: General | Site: Knee | Laterality: Right

## 2020-01-06 MED ORDER — LIDOCAINE HCL (PF) 1 % IJ SOLN
INTRAMUSCULAR | Status: AC
  Filled 2020-01-06: qty 30

## 2020-01-06 MED ORDER — ACETAMINOPHEN 10 MG/ML IV SOLN
INTRAVENOUS | Status: DC | PRN
  Administered 2020-01-06: 1000 mg via INTRAVENOUS

## 2020-01-06 MED ORDER — PROPOFOL 10 MG/ML IV BOLUS
INTRAVENOUS | Status: DC | PRN
  Administered 2020-01-06 (×2): 30 mg via INTRAVENOUS
  Administered 2020-01-06: 170 mg via INTRAVENOUS

## 2020-01-06 MED ORDER — MIDAZOLAM HCL 2 MG/2ML IJ SOLN
INTRAMUSCULAR | Status: DC | PRN
  Administered 2020-01-06: 2 mg via INTRAVENOUS

## 2020-01-06 MED ORDER — CEFAZOLIN SODIUM-DEXTROSE 2-4 GM/100ML-% IV SOLN
INTRAVENOUS | Status: AC
  Filled 2020-01-06: qty 100

## 2020-01-06 MED ORDER — ACETAMINOPHEN 10 MG/ML IV SOLN
INTRAVENOUS | Status: AC
  Filled 2020-01-06: qty 100

## 2020-01-06 MED ORDER — PROPOFOL 500 MG/50ML IV EMUL
INTRAVENOUS | Status: DC | PRN
  Administered 2020-01-06: 50 ug/kg/min via INTRAVENOUS

## 2020-01-06 MED ORDER — ONDANSETRON HCL 4 MG PO TABS: 4.0000 mg | ORAL_TABLET | Freq: Four times a day (QID) | ORAL | Status: DC | PRN

## 2020-01-06 MED ORDER — FENTANYL CITRATE (PF) 100 MCG/2ML IJ SOLN
INTRAMUSCULAR | Status: AC
  Administered 2020-01-06: 25 ug via INTRAVENOUS
  Filled 2020-01-06: qty 2

## 2020-01-06 MED ORDER — LIDOCAINE HCL 1 % IJ SOLN
INTRAMUSCULAR | Status: DC | PRN
  Administered 2020-01-06: 30 mL

## 2020-01-06 MED ORDER — ONDANSETRON HCL 4 MG/2ML IJ SOLN: 4.0000 mg | Freq: Four times a day (QID) | INTRAMUSCULAR | Status: DC | PRN

## 2020-01-06 MED ORDER — HYDROCODONE-ACETAMINOPHEN 5-325 MG PO TABS: 1.0000 | ORAL_TABLET | ORAL | Status: DC | PRN

## 2020-01-06 MED ORDER — HYDROCODONE-ACETAMINOPHEN 5-325 MG PO TABS
1.0000 | ORAL_TABLET | Freq: Four times a day (QID) | ORAL | 0 refills | Status: DC | PRN
Start: 2020-01-06 — End: 2020-10-11

## 2020-01-06 MED ORDER — DEXAMETHASONE SODIUM PHOSPHATE 10 MG/ML IJ SOLN
INTRAMUSCULAR | Status: DC | PRN
  Administered 2020-01-06: 10 mg via INTRAVENOUS

## 2020-01-06 MED ORDER — FENTANYL CITRATE (PF) 100 MCG/2ML IJ SOLN
25.0000 ug | INTRAMUSCULAR | Status: DC | PRN
  Administered 2020-01-06 (×2): 25 ug via INTRAVENOUS

## 2020-01-06 MED ORDER — METOCLOPRAMIDE HCL 5 MG/ML IJ SOLN: 5.0000 mg | Freq: Three times a day (TID) | INTRAMUSCULAR | Status: DC | PRN

## 2020-01-06 MED ORDER — ONDANSETRON HCL 4 MG/2ML IJ SOLN
INTRAMUSCULAR | Status: DC | PRN
  Administered 2020-01-06: 4 mg via INTRAVENOUS

## 2020-01-06 MED ORDER — LACTATED RINGERS IV SOLN: INTRAVENOUS | Status: DC

## 2020-01-06 MED ORDER — CHLORHEXIDINE GLUCONATE 0.12 % MT SOLN
OROMUCOSAL | Status: AC
  Administered 2020-01-06: 15 mL via OROMUCOSAL
  Filled 2020-01-06: qty 15

## 2020-01-06 MED ORDER — KETOROLAC TROMETHAMINE 30 MG/ML IJ SOLN
INTRAMUSCULAR | Status: DC | PRN
  Administered 2020-01-06: 30 mg via INTRAVENOUS

## 2020-01-06 MED ORDER — FENTANYL CITRATE (PF) 100 MCG/2ML IJ SOLN
INTRAMUSCULAR | Status: AC
  Filled 2020-01-06: qty 2

## 2020-01-06 MED ORDER — PROPOFOL 10 MG/ML IV BOLUS
INTRAVENOUS | Status: AC
  Filled 2020-01-06: qty 40

## 2020-01-06 MED ORDER — LIDOCAINE HCL (CARDIAC) PF 100 MG/5ML IV SOSY
PREFILLED_SYRINGE | INTRAVENOUS | Status: DC | PRN
  Administered 2020-01-06: 80 mg via INTRAVENOUS

## 2020-01-06 MED ORDER — ONDANSETRON HCL 4 MG/2ML IJ SOLN: 4.0000 mg | Freq: Once | INTRAMUSCULAR | Status: DC | PRN

## 2020-01-06 MED ORDER — ORAL CARE MOUTH RINSE: 15.0000 mL | Freq: Once | OROMUCOSAL | Status: AC

## 2020-01-06 MED ORDER — CHLORHEXIDINE GLUCONATE 0.12 % MT SOLN: 15.0000 mL | Freq: Once | OROMUCOSAL | Status: AC

## 2020-01-06 MED ORDER — BUPIVACAINE-EPINEPHRINE (PF) 0.5% -1:200000 IJ SOLN
INTRAMUSCULAR | Status: AC
  Filled 2020-01-06: qty 60

## 2020-01-06 MED ORDER — METOCLOPRAMIDE HCL 10 MG PO TABS: 5.0000 mg | ORAL_TABLET | Freq: Three times a day (TID) | ORAL | Status: DC | PRN

## 2020-01-06 MED ORDER — MIDAZOLAM HCL 2 MG/2ML IJ SOLN
INTRAMUSCULAR | Status: AC
  Filled 2020-01-06: qty 2

## 2020-01-06 MED ORDER — FENTANYL CITRATE (PF) 100 MCG/2ML IJ SOLN
INTRAMUSCULAR | Status: DC | PRN
  Administered 2020-01-06: 50 ug via INTRAVENOUS

## 2020-01-06 MED ORDER — BUPIVACAINE-EPINEPHRINE (PF) 0.5% -1:200000 IJ SOLN
INTRAMUSCULAR | Status: DC | PRN
  Administered 2020-01-06: 30 mL via PERINEURAL

## 2020-01-06 MED ORDER — CEFAZOLIN SODIUM-DEXTROSE 2-4 GM/100ML-% IV SOLN
2.0000 g | INTRAVENOUS | Status: AC
  Administered 2020-01-06: 2 g via INTRAVENOUS

## 2020-01-06 SURGICAL SUPPLY — 38 items
"PENCIL ELECTRO HAND CTR " (MISCELLANEOUS) ×1 IMPLANT
APL PRP STRL LF DISP 70% ISPRP (MISCELLANEOUS) ×1
BAG COUNTER SPONGE EZ (MISCELLANEOUS) IMPLANT
BAG SPNG 4X4 CLR HAZ (MISCELLANEOUS)
BLADE FULL RADIUS 3.5 (BLADE) ×2 IMPLANT
BLADE SHAVER 4.5X7 STR FR (MISCELLANEOUS) ×2 IMPLANT
BNDG ELASTIC 6X5.8 VLCR STR LF (GAUZE/BANDAGES/DRESSINGS) ×2 IMPLANT
CHLORAPREP W/TINT 26 (MISCELLANEOUS) ×2 IMPLANT
COVER WAND RF STERILE (DRAPES) ×2 IMPLANT
CUFF TOURN SGL QUICK 24 (TOURNIQUET CUFF)
CUFF TOURN SGL QUICK 30 (TOURNIQUET CUFF)
CUFF TRNQT CYL 24X4X16.5-23 (TOURNIQUET CUFF) IMPLANT
CUFF TRNQT CYL 30X4X21-28X (TOURNIQUET CUFF) IMPLANT
ELECT REM PT RETURN 9FT ADLT (ELECTROSURGICAL) ×2
ELECTRODE REM PT RTRN 9FT ADLT (ELECTROSURGICAL) ×1 IMPLANT
GAUZE SPONGE 4X4 12PLY STRL (GAUZE/BANDAGES/DRESSINGS) ×2 IMPLANT
GLOVE BIO SURGEON STRL SZ8 (GLOVE) ×4 IMPLANT
GLOVE BIOGEL M 7.0 STRL (GLOVE) ×4 IMPLANT
GLOVE BIOGEL PI IND STRL 7.5 (GLOVE) ×1 IMPLANT
GLOVE BIOGEL PI INDICATOR 7.5 (GLOVE) ×1
GLOVE INDICATOR 8.0 STRL GRN (GLOVE) ×2 IMPLANT
GOWN STRL REUS W/ TWL LRG LVL3 (GOWN DISPOSABLE) ×1 IMPLANT
GOWN STRL REUS W/ TWL XL LVL3 (GOWN DISPOSABLE) ×2 IMPLANT
GOWN STRL REUS W/TWL LRG LVL3 (GOWN DISPOSABLE) ×2
GOWN STRL REUS W/TWL XL LVL3 (GOWN DISPOSABLE) ×4
IV LACTATED RINGER IRRG 3000ML (IV SOLUTION) ×2
IV LR IRRIG 3000ML ARTHROMATIC (IV SOLUTION) ×1 IMPLANT
KIT TURNOVER KIT A (KITS) ×2 IMPLANT
MANIFOLD NEPTUNE II (INSTRUMENTS) ×4 IMPLANT
NDL HYPO 21X1.5 SAFETY (NEEDLE) ×1 IMPLANT
NEEDLE HYPO 21X1.5 SAFETY (NEEDLE) ×2 IMPLANT
PACK ARTHROSCOPY KNEE (MISCELLANEOUS) ×2 IMPLANT
PENCIL ELECTRO HAND CTR (MISCELLANEOUS) ×2 IMPLANT
SUT PROLENE 4 0 PS 2 18 (SUTURE) ×2 IMPLANT
SUT TICRON COATED BLUE 2 0 30 (SUTURE) IMPLANT
SYR 50ML LL SCALE MARK (SYRINGE) ×2 IMPLANT
TUBING ARTHRO INFLOW-ONLY STRL (TUBING) ×2 IMPLANT
WAND WEREWOLF FLOW 90D (MISCELLANEOUS) ×2 IMPLANT

## 2020-01-06 NOTE — Anesthesia Preprocedure Evaluation (Signed)
Anesthesia Evaluation  Patient identified by MRN, date of birth, ID band Patient awake    Reviewed: Allergy & Precautions, H&P , NPO status , Patient's Chart, lab work & pertinent test results, reviewed documented beta blocker date and time   History of Anesthesia Complications (+) PONV and history of anesthetic complications  Airway Mallampati: II  TM Distance: >3 FB Neck ROM: full    Dental  (+) Teeth Intact   Pulmonary neg pulmonary ROS,    Pulmonary exam normal        Cardiovascular Exercise Tolerance: Good hypertension, On Medications negative cardio ROS Normal cardiovascular exam Rate:Normal     Neuro/Psych  Neuromuscular disease negative psych ROS   GI/Hepatic Neg liver ROS, GERD  Medicated,  Endo/Other  Hypothyroidism   Renal/GU negative Renal ROS  negative genitourinary   Musculoskeletal   Abdominal   Peds  Hematology negative hematology ROS (+)   Anesthesia Other Findings   Reproductive/Obstetrics negative OB ROS                             Anesthesia Physical Anesthesia Plan  ASA: II  Anesthesia Plan: General LMA   Post-op Pain Management:    Induction:   PONV Risk Score and Plan: 4 or greater  Airway Management Planned:   Additional Equipment:   Intra-op Plan:   Post-operative Plan:   Informed Consent: I have reviewed the patients History and Physical, chart, labs and discussed the procedure including the risks, benefits and alternatives for the proposed anesthesia with the patient or authorized representative who has indicated his/her understanding and acceptance.       Plan Discussed with: CRNA  Anesthesia Plan Comments:         Anesthesia Quick Evaluation

## 2020-01-06 NOTE — Anesthesia Postprocedure Evaluation (Signed)
Anesthesia Post Note  Patient: Melanie Beltran  Procedure(s) Performed: KNEE ARTHROSCOPY WITH DEBRIDEMENT AND PARTIAL LATERAL MENISCECTOMY. (Right Knee)  Patient location during evaluation: PACU Anesthesia Type: General Level of consciousness: awake and alert Pain management: pain level controlled Vital Signs Assessment: post-procedure vital signs reviewed and stable Respiratory status: spontaneous breathing, nonlabored ventilation, respiratory function stable and patient connected to nasal cannula oxygen Cardiovascular status: blood pressure returned to baseline and stable Postop Assessment: no apparent nausea or vomiting Anesthetic complications: no   No complications documented.   Last Vitals:  Vitals:   01/06/20 1045 01/06/20 1054  BP: 128/65 (!) 159/80  Pulse: 90 85  Resp: 18 16  Temp: 36.9 C 36.7 C  SpO2: 98% 100%    Last Pain:  Vitals:   01/06/20 1054  TempSrc: Oral  PainSc: 3                  Yevette Edwards

## 2020-01-06 NOTE — Discharge Instructions (Addendum)
Orthopedic discharge instructions: Keep dressing dry and intact.  May shower after dressing changed on post-op day #4 (Sunday).  Cover sutures with Band-Aids after drying off. Apply ice frequently to knee. Take ibuprofen 600-800 mg TID with meals for 7-10 days, then as necessary.  TID = three times per day (every 8 hours) Take pain medication as prescribed or ES Tylenol when needed.  May weight-bear as tolerated - use crutches or walker as needed. Follow-up in 10-14 days or as scheduled.   AMBULATORY SURGERY  DISCHARGE INSTRUCTIONS   1) The drugs that you were given will stay in your system until tomorrow so for the next 24 hours you should not:  A) Drive an automobile B) Make any legal decisions C) Drink any alcoholic beverage   2) You may resume regular meals tomorrow.  Today it is better to start with liquids and gradually work up to solid foods.  You may eat anything you prefer, but it is better to start with liquids, then soup and crackers, and gradually work up to solid foods.   3) Please notify your doctor immediately if you have any unusual bleeding, trouble breathing, redness and pain at the surgery site, drainage, fever, or pain not relieved by medication.    4) Additional Instructions:        Please contact your physician with any problems or Same Day Surgery at [phone], Monday through Friday 6 am to 4 pm, or [facility] at [facility] number at [phone].

## 2020-01-06 NOTE — Op Note (Signed)
01/06/2020  10:00 AM  Patient:   Melanie Beltran  Pre-Op Diagnosis:   Lateral meniscus tear with underlying degenerative joint disease, right knee.  Postoperative diagnosis:   Lateral meniscus tear with symptomatic suprapatella plica and early degenerative joint disease, right knee.  Procedure:   Arthroscopic partial lateral meniscectomy, arthroscopic debridement of symptomatic plica, and abrasion chondroplasty of femoral trochlea and medial femoral condyle, right knee.  Surgeon:   Maryagnes Amos, MD  Anesthesia:   General LMA  Findings:   As above. There were diffuse grade 3 chondromalacial changes involving the femoral trochlea, patella, and medial femoral condyle. There also was a focal area of grade III-IV chondromalacia involving the medial edge of the medial femoral condyle, consistent with a "kissing lesion". Laterally, the articular surfaces were in satisfactory condition. The medial meniscus was in satisfactory condition, as were the anterior and posterior cruciate ligaments.  Complications:   None  EBL:   10 cc.  Total fluids:   800 cc of crystalloid.  Tourniquet time:   None  Drains:   None  Closure:   4-0 Prolene interrupted sutures.  Brief clinical note:   The patient is a 54 year old female with a history of progressively worsening lateral sided right knee pain. Her symptoms have persisted despite medications, activity modification, etc. Her history and examination were consistent with a lateral meniscus tear which was confirmed by MRI scan. The patient presents at this time for arthroscopy, debridement, and partial lateral meniscectomy.  Procedure:   The patient was brought into the operating room and lain in the supine position. After adequate general laryngeal mask anesthesia was obtained, a timeout was performed to verify the appropriate side. The patient's right knee was injected sterilely using a solution of 30 cc of 1% lidocaine and 30 cc of 0.5% Sensorcaine  with epinephrine. The right lower extremity was prepped with ChloraPrep solution before being draped sterilely. Preoperative antibiotics were administered. The expected portal sites were injected with 0.5% Sensorcaine with epinephrine before the camera was placed in the anterolateral portal and instrumentation performed through the anteromedial portal.   The knee was sequentially examined beginning in the suprapatellar pouch, then progressing to the patellofemoral space, the medial gutter and compartment, the notch, and finally the lateral compartment and gutter. The findings were as described above. Abundant reactive synovial tissues anteriorly were debrided using the full-radius resector in order to improve visualization. The area of tearing along the midportion of the lateral meniscus was debrided back to stable margins using a combination of the straight and side-biting baskets, as well as the full-radius resector. Subsequent probing of the remaining rim demonstrated excellent stability. Areas of grade 2-3 chondromalacial changes involving the femoral trochlea and patella were debrided back to stable margins using the full-radius resector, as was the area of focal grade III-IV chondromalacia involving the medial edge of the medial femoral condyle. The instruments were removed from the joint after suctioning the excess fluid.   The portal sites were closed using 4-0 Prolene interrupted sutures before a sterile bulky dressing was applied to the knee. The patient was then awakened, extubated, and returned to the recovery room in satisfactory condition after tolerating the procedure well.

## 2020-01-06 NOTE — Transfer of Care (Signed)
Immediate Anesthesia Transfer of Care Note  Patient: Melanie Beltran  Procedure(s) Performed: KNEE ARTHROSCOPY WITH DEBRIDEMENT AND PARTIAL LATERAL MENISCECTOMY. (Right Knee)  Patient Location: PACU  Anesthesia Type:General  Level of Consciousness: awake, drowsy and patient cooperative  Airway & Oxygen Therapy: Patient Spontanous Breathing  Post-op Assessment: Report given to RN and Post -op Vital signs reviewed and stable  Post vital signs: Reviewed and stable  Last Vitals:  Vitals Value Taken Time  BP 142/71 01/06/20 1005  Temp    Pulse 103 01/06/20 1009  Resp 16 01/06/20 1009  SpO2 97 % 01/06/20 1009  Vitals shown include unvalidated device data.  Last Pain:  Vitals:   01/06/20 0735  TempSrc: Tympanic  PainSc: 0-No pain         Complications: No complications documented.

## 2020-01-06 NOTE — Anesthesia Procedure Notes (Signed)
Procedure Name: LMA Insertion Date/Time: 01/06/2020 9:12 AM Performed by: Henrietta Hoover, CRNA Pre-anesthesia Checklist: Patient identified, Emergency Drugs available, Suction available and Patient being monitored Patient Re-evaluated:Patient Re-evaluated prior to induction Oxygen Delivery Method: Circle system utilized Preoxygenation: Pre-oxygenation with 100% oxygen Induction Type: IV induction Ventilation: Mask ventilation without difficulty LMA: LMA inserted LMA Size: 4.0 Number of attempts: 1 Placement Confirmation: positive ETCO2 and breath sounds checked- equal and bilateral Tube secured with: Tape Dental Injury: Teeth and Oropharynx as per pre-operative assessment

## 2020-01-06 NOTE — H&P (Signed)
History of Present Illness: Melanie Beltran is a 54 y.o.female who is being referred by Cranston Neighbor, PA-C, for right knee pain. The symptoms began several months ago and developed without any specific cause or injury. She reports 5/10 pain. The pain is located along the lateral aspect of the knee. The pain is described as aching, dull and stabbing. The symptoms are aggravated with normal daily activities, using stairs, at higher levels of activity, walking, standing, standing pivot and exercising. She also describes no mechanical symptoms. She has no associated swelling and no deformity. She has tried anti-inflammatories and steroid injections with no significant benefit.  Current Outpatient Medications: . albuterol 90 mcg/actuation inhaler Inhale 2 inhalations into the lungs every 4 (four) hours as needed for Wheezing or Shortness of Breath 1 Inhaler 0  . dexlansoprazole (DEXILANT) 60 mg DR capsule Take 1 capsule (60 mg total) by mouth once daily 30 capsule 5  . dicyclomine (BENTYL) 10 mg capsule Take 1 capsule (10 mg total) by mouth 4 (four) times daily before meals and nightly As needed 120 capsule 1  . gabapentin (NEURONTIN) 100 MG capsule Take 1 capsule (100 mg total) by mouth 3 (three) times daily 90 capsule 11  . HYDROcodone-acetaminophen (NORCO) 5-325 mg tablet  . losartan (COZAAR) 100 MG tablet Take 1 tablet (100 mg total) by mouth once daily 90 tablet 3  . meloxicam (MOBIC) 15 MG tablet Take 1 tablet (15 mg total) by mouth once daily 30 tablet 0  . metoprolol succinate (TOPROL-XL) 50 MG XL tablet Take 1 tablet (50 mg total) by mouth once daily 90 tablet 1  . SUMAtriptan (IMITREX) 100 MG tablet Take 1 tablet (100 mg total) by mouth once as needed for Migraine May take a second dose after 2 hours if needed. 18 tablet 5  . SYNTHROID 175 mcg tablet Take 1 tablet (175 mcg total) by mouth once daily Take on an empty stomach with a glass of water at least 30-60 minutes before breakfast. 90 tablet  3  . triamcinolone 0.1 % cream triamcinolone acetonide 0.1 % topical cream  . valACYclovir (VALTREX) 1000 MG tablet   Allergies:  . Asacol [Mesalamine] Anaphylaxis and Other (See Comments)  Throat closes  . Shellfish Containing Products Unknown   Past Medical History:  . Allergic state  . Asthma without status asthmaticus, unspecified  . Essential hypertension  . GERD (gastroesophageal reflux disease)  . History of chicken pox  . Hypothyroidism  . IBS (irritable bowel syndrome) 2000  Diagnosed at [facility]  . Ileitis, unspecified  . Migraine headache  . Recurrent sinusitis  . Situational depression   Past Surgical History:  . CATARACT EXTRACTION Bilateral  . COLONOSCOPY 01/22/2012, 10/25/2008  Dr. Demetrius Charity. [place] - atyp. anal tags, rpt 5 yrs per [facility]  . COLONOSCOPY 02/03/2000  Dr. Oval Linsey @ [facility]  . EGD 01/22/2012, 01/18/2001  . FLEXIBLE SIGMOIDOSCOPY 07/01/1990  . FRACTURE SURGERY Left 10/24/09  Left elbow fracture/repair  . FUNCTIONAL ENDOSCOPIC SINUS SURGERY  . HYSTERECTOMY   Family History:  . Lymphoma Father  . Thyroid disease Father  . Stroke Mother  . Ovarian cancer Mother  . Myocardial Infarction (Heart attack) Mother  . Lung cancer Mother  . Leukemia Maternal Grandmother  . High blood pressure (Hypertension) Maternal Grandfather  . Stroke Maternal Grandfather   Social History:   Socioeconomic History:  Marland Kitchen Marital status: Married  Spouse name: Not on file  . Number of children: Not on file  . Years of education: Not  on file  . Highest education level: Not on file  Occupational History  . Not on file  Tobacco Use  . Smoking status: Never Smoker  . Smokeless tobacco: Never Used  Vaping Use  . Vaping Use: Never used  Substance and Sexual Activity  . Alcohol use: Yes  Alcohol/week: 0.0 standard drinks  . Drug use: No  . Sexual activity: Yes  Other Topics Concern  . Not on file  Social History Narrative  Education: na  Occupation: na  Hobbies: na  Marital  Status: married   Social Determinants of Health:   Programmer, applications: Not on file  Food Insecurity: Not on file  Transportation Needs: Not on file   Review of Systems:  A comprehensive 14 point ROS was performed, reviewed, and the pertinent orthopaedic findings are documented in the HPI.  Physical Exam: Vitals:  12/14/19 0803  BP: 120/84  Weight: 76.8 kg (169 lb 6.4 oz)  Height: 157.5 cm (5\' 2" )  PainSc: 5  PainLoc: Knee   General/Constitutional: The patient appears to be well-nourished, well-developed, and in no acute distress. Neuro/Psych: Normal mood and affect, oriented to person, place and time. Eyes: Non-icteric. Pupils are equal, round, and reactive to light, and exhibit synchronous movement. Lymphatic: No palpable adenopathy. Respiratory: Lungs clear to auscultation, Normal chest excursion, No wheezes and Non-labored breathing Cardiovascular: Regular rate and rhythm. No murmurs. and No edema, swelling or tenderness, except as noted in detailed exam. Vascular: No edema, swelling or tenderness, except as noted in detailed exam. Integumentary: No impressive skin lesions present, except as noted in detailed exam. Musculoskeletal: Unremarkable, except as noted in detailed exam.  Right knee exam: GAIT: normal and uses no assistive devices. ALIGNMENT: normal SKIN: unremarkable SWELLING: minimal EFFUSION: At most a trace WARMTH: no warmth TENDERNESS: moderate tenderness over the lateral joint line, but no medial joint line tenderness ROM: full without pain McMURRAY'S: equivocal PATELLOFEMORAL: normal tracking with no peri-patellar tenderness and negative apprehension sign CREPITUS: Trace patellofemoral crepitance LACHMAN'S: negative PIVOT SHIFT: negative ANTERIOR DRAWER: negative POSTERIOR DRAWER: negative VARUS/VALGUS: stable  She is neurovascularly intact to the right lower extremity and foot.  Knee Imaging, external: Right knee: A recent MRI scan of the  right knee is available for review. By report, the scan demonstrates evidence of a "Tiny radial tear of the free edge of the anterior horn-body junction of the lateral meniscus. Partial discoid lateral meniscus." There is evidence of early degenerative changes involving the patellofemoral and medial compartments as evidenced by partial-thickness cartilage loss. The medial meniscus appears to be in good condition. No ligamentous pathology is noted. Both the films and report were reviewed by myself and discussed with the patient.  Assessment:  . Primary osteoarthritis of right knee  . Complex tear of lateral meniscus of right knee as current injury . Patellofemoral pain syndrome of right knee   Plan: The treatment options were discussed with the patient. In addition, patient educational materials were provided regarding the diagnosis and treatment options. The patient is quite frustrated by her symptoms and functional limitations, and is ready to consider more aggressive treatment options. Therefore, I have recommended a surgical procedure, specifically a right knee arthroscopy with debridement and partial lateral meniscectomy. The procedure was discussed with the patient, as were the potential risks (including bleeding, infection, nerve and/or blood vessel injury, persistent or recurrent pain, failure of the repair, progression of arthritis, need for further surgery, blood clots, strokes, heart attacks and/or arhythmias, pneumonia, etc.) and benefits. The patient  states her understanding and wishes to proceed. All of the patient's questions and concerns were answered. She can call any time with further concerns. She will follow up post-surgery, routine.   H&P reviewed and patient re-examined. No changes.

## 2020-03-25 IMAGING — MG DIGITAL DIAGNOSTIC BILATERAL MAMMOGRAM WITH IMPLANTS, CAD AND TO
8 of 13 series · 8 of 37 positions shown · non-contrast
Comparison: Previous exam(s).

CLINICAL DATA: The patient was called back for a right breast
asymmetry. Patient also has an inferior left breast asymmetry on the
MLO pushback view.

EXAM:
DIGITAL DIAGNOSTIC BILATERAL MAMMOGRAM WITH IMPLANTS AND TOMO
ULTRASOUND BILATERAL BREAST
The patient has retropectoral implants. Standard and implant
displaced views were performed.

[R ML]
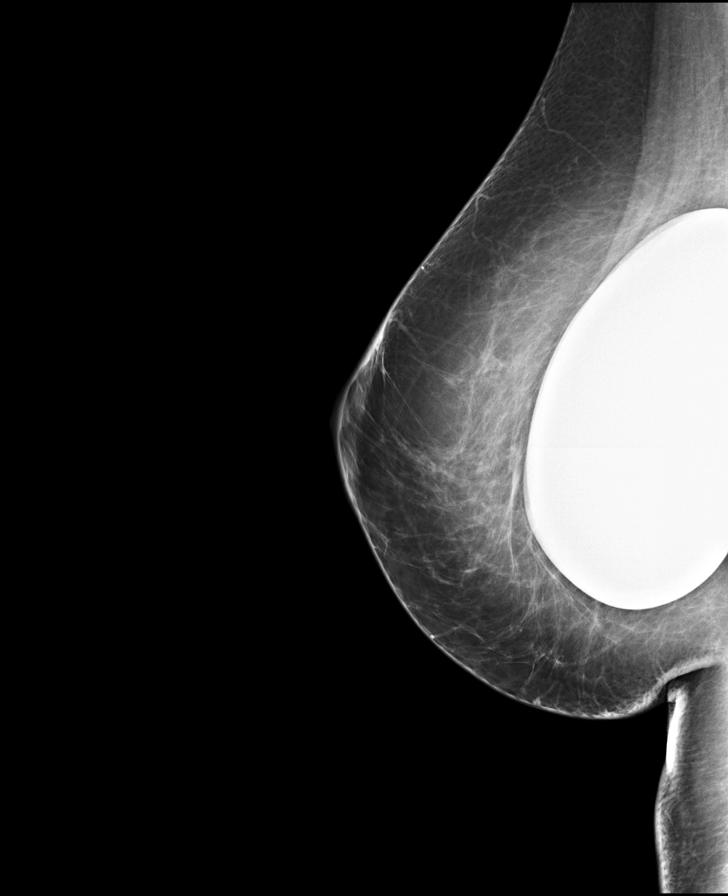

[R CC synth-2D (1 of 2)]
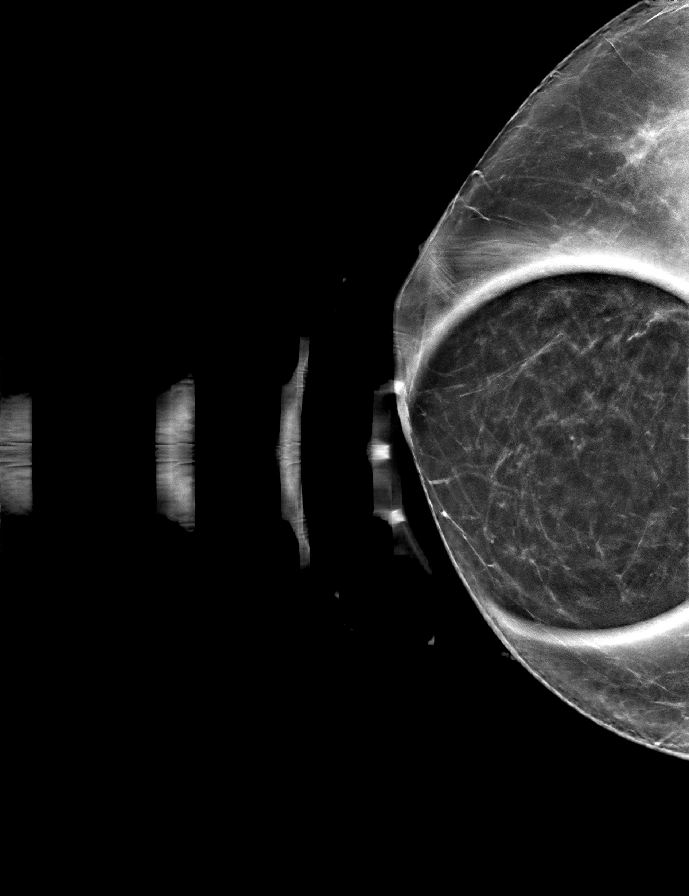

[R CC synth-2D (2 of 2)]
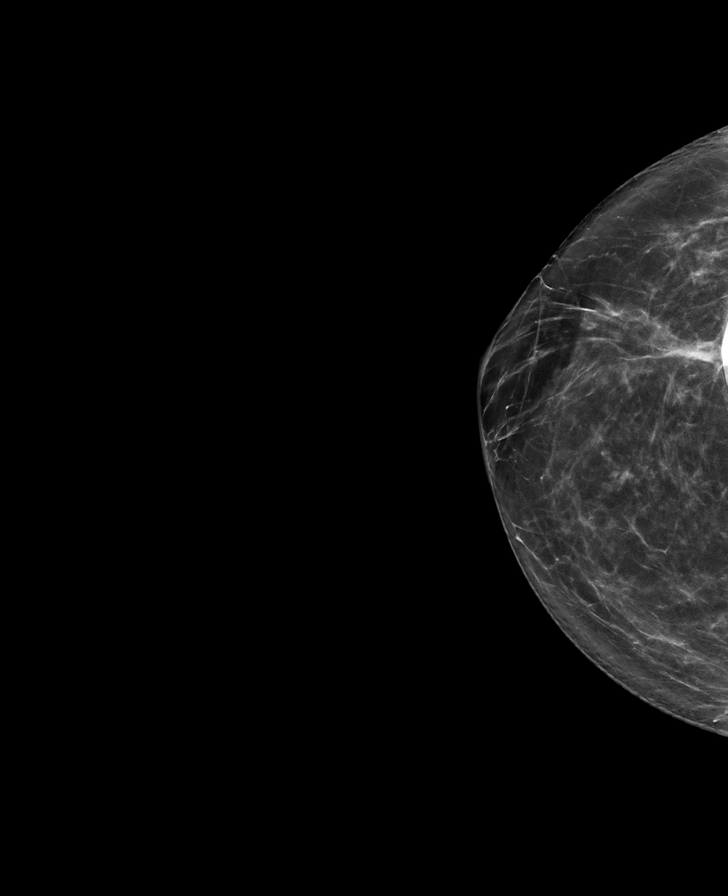

[L MLO synth-2D (1 of 2)]
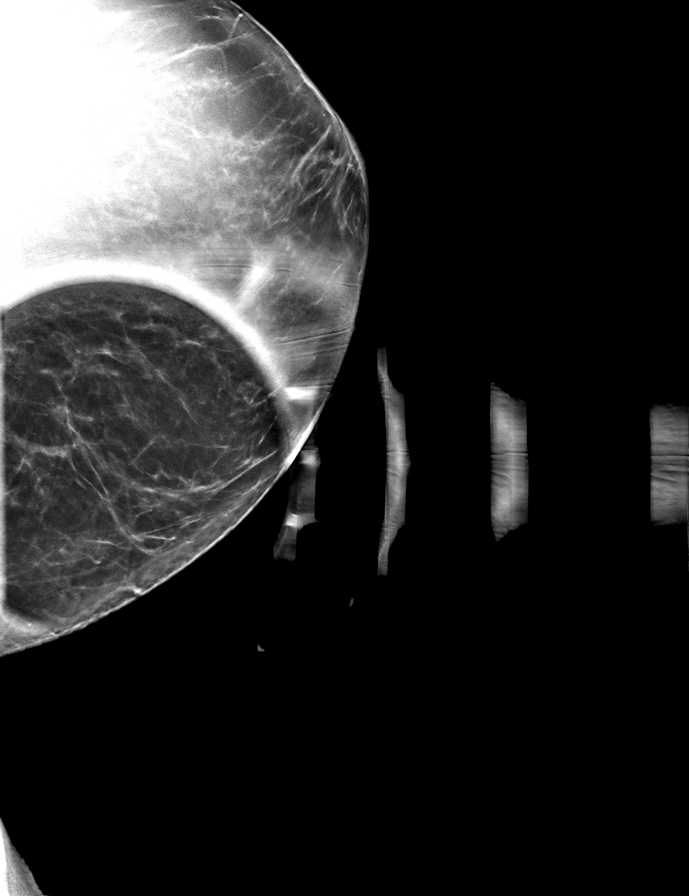

[R ML synth-2D]
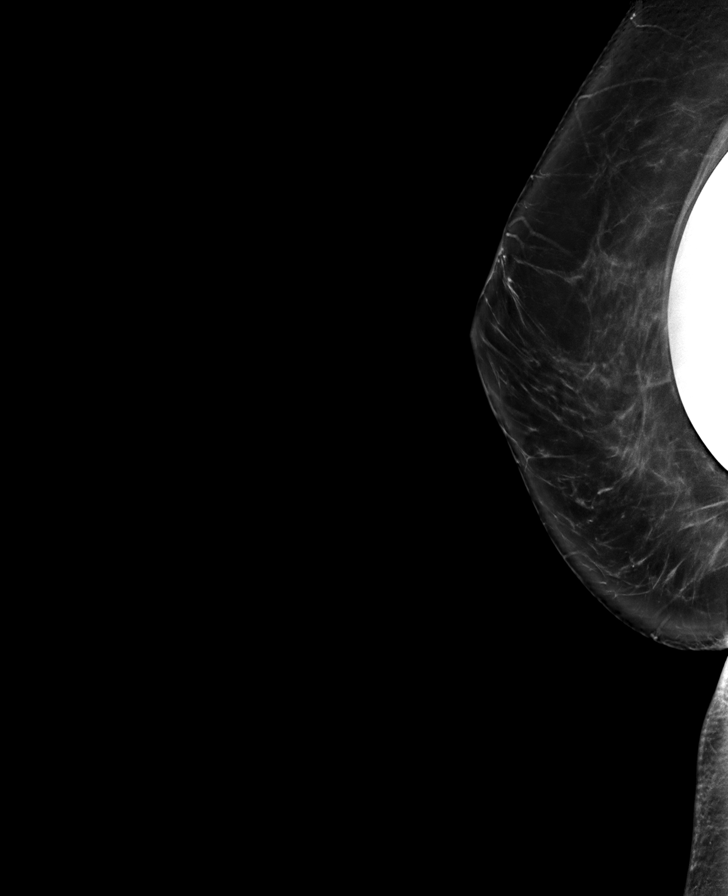

[L MLO synth-2D (2 of 2)]
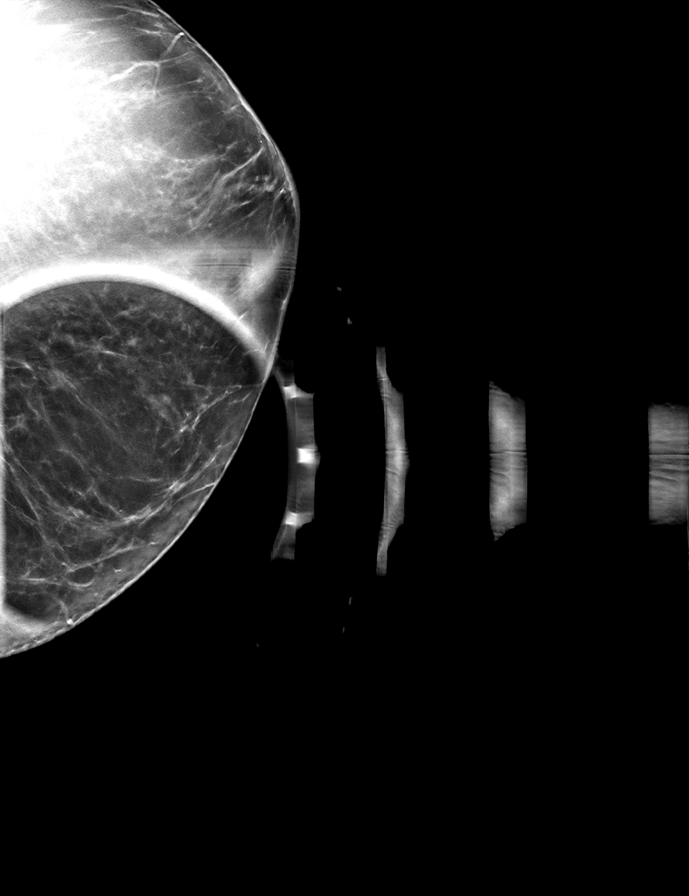

[L ML synth-2D]
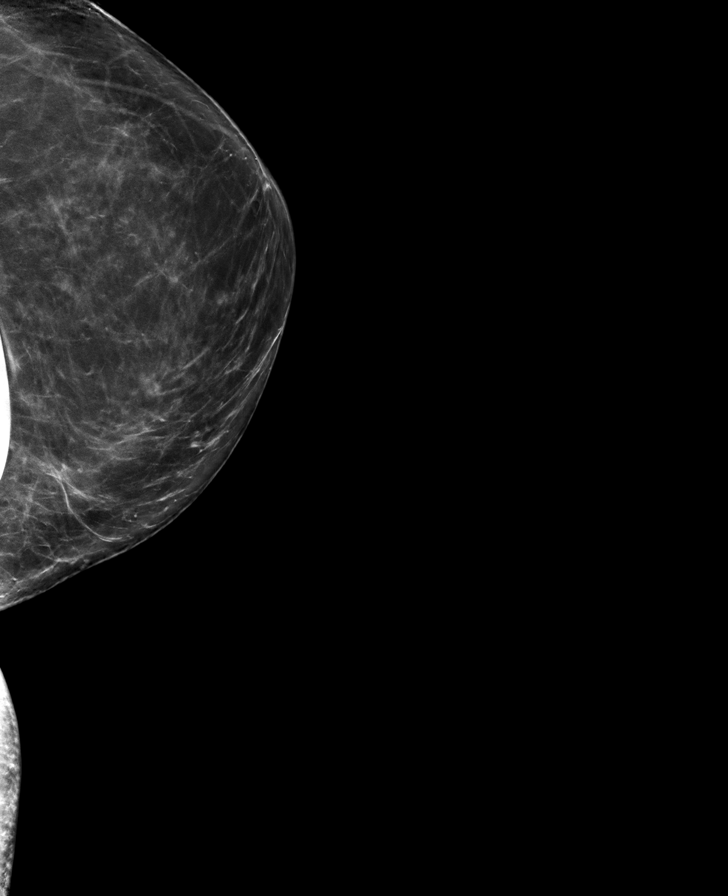

[R CC tomo · tomo slice 38/75.0]
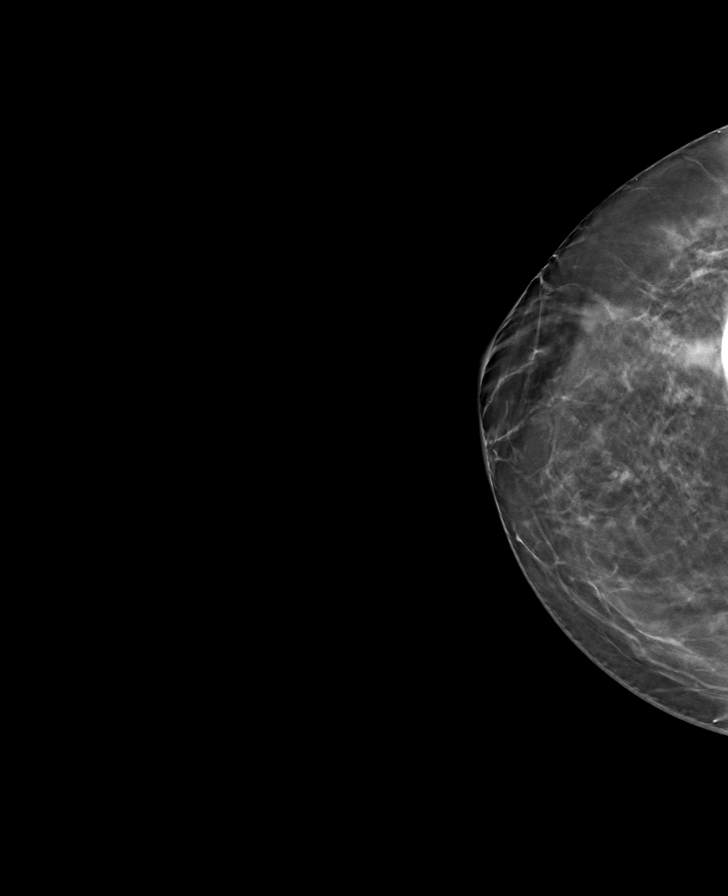

[8 of 37 positions shown; findings below may reference images not displayed]

ACR Breast Density Category b: There are scattered areas of
fibroglandular density.
FINDINGS: The right breast asymmetry improves but does not completely resolve
on additional imaging. It is difficult to compare to previous
studies as the patient has never had 3D imaging prior to the most
recent study.

The asymmetry in the inferior left breast improves but does not
completely resolve on additional imaging. It is difficult to compare
to previous studies as the patient has never had 3D imaging.

No other suspicious findings in either breast.

On physical exam, no suspicious lumps are identified.

Targeted ultrasound is performed, showing no sonographic correlate
in the medial right breast. There is also no definitive center
graphic correlate in the inferior left breast.
IMPRESSION: Probably benign asymmetries in both breast. Increased conspicuity
could be due to lack of 3D imaging previously.

RECOMMENDATION:
Recommend six-month follow-up mammography to ensure stability or
resolution the asymmetries.

I have discussed the findings and recommendations with the patient.
Results were also provided in writing at the conclusion of the
visit. If applicable, a reminder letter will be sent to the patient
regarding the next appointment.

BI-RADS CATEGORY  3: Probably benign.

## 2020-04-06 ENCOUNTER — Other Ambulatory Visit: Payer: Self-pay | Admitting: Family Medicine

## 2020-04-06 DIAGNOSIS — N6489 Other specified disorders of breast: Secondary | ICD-10-CM

## 2020-05-02 ENCOUNTER — Ambulatory Visit
Admission: RE | Admit: 2020-05-02 | Discharge: 2020-05-02 | Disposition: A | Discharge status: Home / Self Care | Payer: BC Managed Care – PPO | Source: Ambulatory Visit | Attending: Family Medicine | Admitting: Family Medicine

## 2020-05-02 ENCOUNTER — Other Ambulatory Visit: Payer: Self-pay

## 2020-05-02 DIAGNOSIS — N6489 Other specified disorders of breast: Secondary | ICD-10-CM

## 2020-10-11 ENCOUNTER — Ambulatory Visit
Admission: EM | Admit: 2020-10-11 | Discharge: 2020-10-11 | Disposition: A | Discharge status: Home / Self Care | Payer: 59 | Attending: Physician Assistant | Admitting: Physician Assistant

## 2020-10-11 ENCOUNTER — Other Ambulatory Visit: Payer: Self-pay

## 2020-10-11 ENCOUNTER — Encounter: Payer: Self-pay | Admitting: Emergency Medicine

## 2020-10-11 DIAGNOSIS — R059 Cough, unspecified: Secondary | ICD-10-CM | POA: Diagnosis not present

## 2020-10-11 DIAGNOSIS — H66005 Acute suppurative otitis media without spontaneous rupture of ear drum, recurrent, left ear: Secondary | ICD-10-CM | POA: Diagnosis not present

## 2020-10-11 DIAGNOSIS — J0191 Acute recurrent sinusitis, unspecified: Secondary | ICD-10-CM

## 2020-10-11 MED ORDER — AMOXICILLIN-POT CLAVULANATE 875-125 MG PO TABS: 1.0000 | ORAL_TABLET | Freq: Two times a day (BID) | ORAL | 0 refills | Status: DC

## 2020-10-11 MED ORDER — PREDNISONE 10 MG PO TABS: ORAL_TABLET | ORAL | 0 refills | Status: AC

## 2020-10-11 NOTE — ED Provider Notes (Signed)
[facility]    CSN: [id number] Arrival date & time: 10/11/20  0855      History   Chief Complaint Chief Complaint  Patient presents with   Cough   Otalgia    left    HPI Melanie Beltran is a 55 y.o. female presenting for approximately 3-day history of left-sided ear pain and sinus pain as well as cough and nasal congestion.  Patient admits to recurrent sinus infections and ear infections on the left side.  States she has an appointment with her ENT specialist on September 12 to consider ET tube placement.  Patient states she is still uncomfortable now and believes she may need an antibiotic and corticosteroid.  She states she was treated 2 months ago for this with Augmentin and prednisone and it helped.  She has been taking over-the-counter Mucinex.  She denies any associated fevers and no exposure to COVID-19.  Not vaccinated for COVID-19 and declines any COVID testing.  No other complaints.  HPI  Past Medical History:  Diagnosis Date   Arthritis    Chest pain with low risk for cardiac etiology    Fibromyalgia    GERD (gastroesophageal reflux disease)    History of 2019 novel coronavirus disease (COVID-19) 01/18/2019   Hypertension    Hypothyroidism    PONV (postoperative nausea and vomiting)    Shingles 12/2019   NEVER HAD ANY BLISTERS-HAD NERVE PAIN ONLY     Patient Active Problem List   Diagnosis Date Noted   Hypothyroidism 12/10/2018   HTN (hypertension) 12/10/2018   Pancreatitis 12/10/2018   Acute pancreatitis 12/10/2018    Past Surgical History:  Procedure Laterality Date   ABDOMINAL HYSTERECTOMY  1995   AUGMENTATION MAMMAPLASTY  2004/2018   breast lift 2018   BREAST ENHANCEMENT SURGERY     EYE SURGERY     cataracts   KNEE ARTHROSCOPY WITH LATERAL MENISECTOMY Right 01/06/2020   Procedure: KNEE ARTHROSCOPY WITH DEBRIDEMENT AND PARTIAL LATERAL MENISCECTOMY.;  Surgeon: Christena Flake, MD;  Location: [facility] ORS;  Service: Orthopedics;  Laterality:  Right;   LAPAROSCOPY N/A 04/14/2018   Procedure: LAPAROSCOPY OPERATIVE with biopsies;  Surgeon: Hildred Laser, MD;  Location: [facility] ORS;  Service: Gynecology;  Laterality: N/A;   LEFT OOPHORECTOMY     NASAL SINUS SURGERY      OB History     Gravida  2   Para  2   Term  2   Preterm  0   AB  0   Living  2      SAB  0   IAB  0   Ectopic  0   Multiple  0   Live Births  2            Home Medications    Prior to Admission medications   Medication Sig Start Date End Date Taking? Authorizing Provider  amoxicillin-clavulanate (AUGMENTIN) 875-125 MG tablet Take 1 tablet by mouth every 12 (twelve) hours for 10 days. 10/11/20 10/21/20 Yes Eusebio Friendly B, PA-C  DEXILANT 60 MG capsule Take 60 mg by mouth every morning.  02/19/18  Yes [provider]  gabapentin (NEURONTIN) 100 MG capsule Take 100 mg by mouth 2 (two) times daily. TAKING FOR SHINGLES   Yes [provider]  levothyroxine (SYNTHROID) 175 MCG tablet Take 175 mcg by mouth daily before breakfast.  10/17/17 10/11/20 Yes [provider]  metoprolol succinate (TOPROL-XL) 50 MG 24 hr tablet Take 50 mg by mouth at bedtime.  05/07/17  10/11/20 Yes [provider]  predniSONE (DELTASONE) 10 MG tablet Take 5 tablets p.o. on day 1 and decrease by 1 tablet daily until complete 10/11/20  Yes Shirlee Latch, PA-C    Family History Family History  Problem Relation Age of Onset   Cancer Mother    Non-Hodgkin's lymphoma Father    Breast cancer Neg Hx    Ovarian cancer Neg Hx    Colon cancer Neg Hx     Social History Social History   Tobacco Use   Smoking status: Never   Smokeless tobacco: Never  Vaping Use   Vaping Use: Never used  Substance Use Topics   Alcohol use: Yes    Comment: occasional     Drug use: Never     Allergies   Mesalamine and Shellfish allergy   Review of Systems Review of Systems  Constitutional:  Negative for chills, diaphoresis, fatigue and fever.  HENT:   Positive for congestion, ear pain, rhinorrhea, sinus pressure and sinus pain. Negative for sore throat.   Respiratory:  Positive for cough. Negative for shortness of breath.   Gastrointestinal:  Negative for abdominal pain, nausea and vomiting.  Musculoskeletal:  Negative for arthralgias and myalgias.  Skin:  Negative for rash.  Neurological:  Negative for weakness and headaches.  Hematological:  Negative for adenopathy.    Physical Exam Triage Vital Signs ED Triage Vitals  Enc Vitals Group     BP 10/11/20 0954 (!) 177/106     Pulse Rate 10/11/20 0954 88     Resp 10/11/20 0954 18     Temp 10/11/20 0954 98.4 F (36.9 C)     Temp Source 10/11/20 0954 Oral     SpO2 10/11/20 0954 99 %     Weight 10/11/20 0952 164 lb 14.5 oz (74.8 kg)     Height 10/11/20 0952 5\' 2"  (1.575 m)     Head Circumference --      Peak Flow --      Pain Score 10/11/20 0952 6     Pain Loc --      Pain Edu? --      Excl. in GC? --    No data found.  Updated Vital Signs BP (!) 165/104   Pulse 88   Temp 98.4 F (36.9 C) (Oral)   Resp 18   Ht 5\' 2"  (1.575 m)   Wt 164 lb 14.5 oz (74.8 kg)   SpO2 99%   BMI 30.16 kg/m      Physical Exam Vitals and nursing note reviewed.  Constitutional:      General: She is not in acute distress.    Appearance: Normal appearance. She is not ill-appearing or toxic-appearing.  HENT:     Head: Normocephalic and atraumatic.     Right Ear: Tympanic membrane, ear canal and external ear normal.     Left Ear: Ear canal and external ear normal. A middle ear effusion is present. Tympanic membrane is injected, erythematous and retracted.     Nose: Congestion and rhinorrhea present.     Mouth/Throat:     Mouth: Mucous membranes are moist.     Pharynx: Oropharynx is clear.  Eyes:     General: No scleral icterus.       Right eye: No discharge.        Left eye: No discharge.     Conjunctiva/sclera: Conjunctivae normal.  Cardiovascular:     Rate and Rhythm: Normal rate and  regular rhythm.  Heart sounds: Normal heart sounds.  Pulmonary:     Effort: Pulmonary effort is normal. No respiratory distress.     Breath sounds: Normal breath sounds.  Musculoskeletal:     Cervical back: Neck supple.  Skin:    General: Skin is dry.  Neurological:     General: No focal deficit present.     Mental Status: She is alert. Mental status is at baseline.     Motor: No weakness.     Gait: Gait normal.  Psychiatric:        Mood and Affect: Mood normal.        Behavior: Behavior normal.        Thought Content: Thought content normal.     UC Treatments / Results  Labs (all labs ordered are listed, but only abnormal results are displayed) Labs Reviewed - No data to display  EKG   Radiology No results found.  Procedures Procedures (including critical care time)  Medications Ordered in UC Medications - No data to display  Initial Impression / Assessment and Plan / UC Course  I have reviewed the triage vital signs and the nursing notes.  Pertinent labs & imaging results that were available during my care of the patient were reviewed by me and considered in my medical decision making (see chart for details).  55 year old female presenting for left-sided ear pain and sinus pain as well as nasal congestion and cough.  Has history of recurrent left-sided sinus infections and ear infections and does have ENT specialist with appointment in 2 weeks.  Exam today is consistent with left otitis media and suspected sinusitis.  Treating her with Augmentin and prednisone since this is worked in the past and has been 2 months since she has been treated.  Advised to keep appointment with ENT specialist but to return here sooner or ED for any acute worsening symptoms.  I did advise her to consider COVID testing if develops a fever, cough or worsening symptoms.  Patient agrees.   Final Clinical Impressions(s) / UC Diagnoses   Final diagnoses:  Recurrent acute suppurative otitis  media without spontaneous rupture of left tympanic membrane  Acute recurrent sinusitis, unspecified location  Cough     Discharge Instructions      You do have an ear infection and likely a sinus infection given your history.  Keep follow-up appointment with your ENT specialist in 2 weeks.  We will treat you as you have been treated in the past with Augmentin and prednisone.  I would also continue Mucinex and use Flonase and nasal saline.  You should consider COVID testing if you develop a fever, worsening cough or breathing issue.  Go to emergency department for any severe acute worsening of any of her symptoms.     ED Prescriptions     Medication Sig Dispense Auth. Provider   amoxicillin-clavulanate (AUGMENTIN) 875-125 MG tablet Take 1 tablet by mouth every 12 (twelve) hours for 10 days. 20 tablet Eusebio Friendly B, PA-C   predniSONE (DELTASONE) 10 MG tablet Take 5 tablets p.o. on day 1 and decrease by 1 tablet daily until complete 15 tablet Shirlee Latch, PA-C      PDMP not reviewed this encounter.   Shirlee Latch, PA-C 10/11/20 1054

## 2020-10-11 NOTE — Discharge Instructions (Addendum)
You do have an ear infection and likely a sinus infection given your history.  Keep follow-up appointment with your ENT specialist in 2 weeks.  We will treat you as you have been treated in the past with Augmentin and prednisone.  I would also continue Mucinex and use Flonase and nasal saline.  You should consider COVID testing if you develop a fever, worsening cough or breathing issue.  Go to emergency department for any severe acute worsening of any of her symptoms.

## 2020-10-11 NOTE — ED Triage Notes (Signed)
Pt c/o left ear pain, cough, nasal congestion, sinus pain and pressure. Started about 3 days ago. Denies fever. Declines covid testing at this time.

## 2020-10-15 ENCOUNTER — Telehealth: Payer: 59 | Admitting: Physician Assistant

## 2020-10-15 DIAGNOSIS — R42 Dizziness and giddiness: Secondary | ICD-10-CM

## 2020-10-15 DIAGNOSIS — H9202 Otalgia, left ear: Secondary | ICD-10-CM | POA: Diagnosis not present

## 2020-10-15 MED ORDER — DOXYCYCLINE HYCLATE 100 MG PO TABS: 100.0000 mg | ORAL_TABLET | Freq: Two times a day (BID) | ORAL | 0 refills | Status: DC

## 2020-10-15 MED ORDER — MECLIZINE HCL 25 MG PO TABS: 25.0000 mg | ORAL_TABLET | Freq: Three times a day (TID) | ORAL | 0 refills | Status: AC | PRN

## 2020-10-15 NOTE — Progress Notes (Signed)
Virtual Visit via Video Note  I connected with Melanie CommentLaurie E Gut on 10/15/20 at  2:15 PM EDT by a video enabled telemedicine application and verified that I am speaking with the correct person using two identifiers.  Location: Patient: Patient's home Provider: Provider's office  Person participating in the virtual visit: Patient and provider    I discussed the limitations of evaluation and management by telemedicine and the availability of in person appointments. The patient expressed understanding and agreed to proceed.  I discussed the assessment and treatment plan with the patient. The patient was provided an opportunity to ask questions and all were answered. The patient agreed with the plan and demonstrated an understanding of the instructions.   The patient was advised to call back or seek an in-person evaluation if the symptoms worsen or if the condition fails to improve as anticipated.  I provided 15 minutes of non-face-to-face time during this encounter.   Demetrio LappingSahar M Arya Boxley, PA-C   Subjective:    Patient ID: Melanie Beltran, female    DOB: December 17, 1965, 55 y.o.   MRN: [id number]  No chief complaint on file.   55 yo F in NAD connects via video for L ear pain and dizziness. States developed L ear pain x 7 days, and had a face to face doctor visit in which she was diagnosed with ' inner ear infection' and was prescribed Augmentin, which she states she has been taking for approx 4 days now. States the L ear pain is the same without much improvement and has now developed dizziness- which she describes as "imbalance" when she stands from sitting position. States she typically gets this dizziness with ear infections. Admits sto pain in the inner ear, described as 'fullness' and also pain with ear manipulation. Admits to hx of recurrent ear infections, follows with ENT and states has scheduled appt in approx 2 weeks. Last infection she states was approx 2 months and may have been  Augmentin. Denies any discharge/redness/swelling on the outside of ear or on face. Denies any hearing loss. Denies any recent water exposure in the ear.   Other Pertinent negatives include no abdominal pain, chest pain, chills, congestion, coughing, diaphoresis, fatigue, fever, headaches, myalgias, nausea, numbness, rash, sore throat, vomiting or weakness.  Patient is in today for L ear pain and dizziness  Past Medical History:  Diagnosis Date   Arthritis    Chest pain with low risk for cardiac etiology    Fibromyalgia    GERD (gastroesophageal reflux disease)    History of 2019 novel coronavirus disease (COVID-19) 01/18/2019   Hypertension    Hypothyroidism    PONV (postoperative nausea and vomiting)    Shingles 12/2019   NEVER HAD ANY BLISTERS-HAD NERVE PAIN ONLY     Past Surgical History:  Procedure Laterality Date   ABDOMINAL HYSTERECTOMY  1995   AUGMENTATION MAMMAPLASTY  2004/2018   breast lift 2018   BREAST ENHANCEMENT SURGERY     EYE SURGERY     cataracts   KNEE ARTHROSCOPY WITH LATERAL MENISECTOMY Right 01/06/2020   Procedure: KNEE ARTHROSCOPY WITH DEBRIDEMENT AND PARTIAL LATERAL MENISCECTOMY.;  Surgeon: Christena FlakePoggi, John J, MD;  Location: [facility];  Service: Orthopedics;  Laterality: Right;   LAPAROSCOPY N/A 04/14/2018   Procedure: LAPAROSCOPY OPERATIVE with biopsies;  Surgeon: Hildred Laserherry, Anika, MD;  Location: [facility];  Service: Gynecology;  Laterality: N/A;   LEFT OOPHORECTOMY     NASAL SINUS SURGERY      Family History  Problem Relation Age of  Onset   Cancer Mother    Non-Hodgkin's lymphoma Father    Breast cancer Neg Hx    Ovarian cancer Neg Hx    Colon cancer Neg Hx     Social History   Socioeconomic History   Marital status: Married    Spouse name: Not on file   Number of children: Not on file   Years of education: Not on file   Highest education level: Not on file  Occupational History   Not on file  Tobacco Use   Smoking status: Never   Smokeless  tobacco: Never  Vaping Use   Vaping Use: Never used  Substance and Sexual Activity   Alcohol use: Yes    Comment: occasional     Drug use: Never   Sexual activity: Yes    Birth control/protection: Surgical  Other Topics Concern   Not on file  Social History Narrative   Not on file   Social Determinants of Health   Financial Resource Strain: Not on file  Food Insecurity: Not on file  Transportation Needs: Not on file  Physical Activity: Not on file  Stress: Not on file  Social Connections: Not on file  Intimate Partner Violence: Not on file    Outpatient Medications Prior to Visit  Medication Sig Dispense Refill   DEXILANT 60 MG capsule Take 60 mg by mouth every morning.      gabapentin (NEURONTIN) 100 MG capsule Take 100 mg by mouth 2 (two) times daily. TAKING FOR SHINGLES     levothyroxine (SYNTHROID) 175 MCG tablet Take 175 mcg by mouth daily before breakfast.      metoprolol succinate (TOPROL-XL) 50 MG 24 hr tablet Take 50 mg by mouth at bedtime.      predniSONE (DELTASONE) 10 MG tablet Take 5 tablets p.o. on day 1 and decrease by 1 tablet daily until complete 15 tablet 0   amoxicillin-clavulanate (AUGMENTIN) 875-125 MG tablet Take 1 tablet by mouth every 12 (twelve) hours for 10 days. 20 tablet 0   No facility-administered medications prior to visit.    Allergies  Allergen Reactions   Mesalamine Anaphylaxis    Throat closes   Shellfish Allergy Anaphylaxis and Nausea And Vomiting    Throat closes    Review of Systems  Constitutional:  Negative for activity change, appetite change, chills, diaphoresis, fatigue and fever.  HENT:  Positive for ear pain. Negative for congestion, ear discharge, postnasal drip, rhinorrhea, sinus pressure, sinus pain, sneezing, sore throat, tinnitus, trouble swallowing and voice change.   Respiratory:  Negative for apnea, cough, choking, chest tightness, shortness of breath, wheezing and stridor.   Cardiovascular:  Negative for chest  pain, palpitations and leg swelling.  Gastrointestinal:  Negative for abdominal pain, diarrhea, nausea and vomiting.  Musculoskeletal:  Negative for myalgias.  Skin:  Negative for rash.  Neurological:  Positive for dizziness. Negative for tremors, syncope, facial asymmetry, speech difficulty, weakness, light-headedness, numbness and headaches.  Hematological:  Negative for adenopathy.  Psychiatric/Behavioral:  Negative for agitation, behavioral problems and confusion.       Objective:    Physical Exam Constitutional:      General: She is not in acute distress.    Appearance: Normal appearance. She is not ill-appearing, toxic-appearing or diaphoretic.     Comments: Pt is observed standing and sitting during video without any imbalance issues.  HENT:     Head:     Comments: No redness or swelling of the outer L ear or face Pulmonary:  Effort: No respiratory distress.  Neurological:     Mental Status: She is alert and oriented to person, place, and time.  Psychiatric:        Mood and Affect: Mood normal.        Behavior: Behavior normal.        Thought Content: Thought content normal.        Judgment: Judgment normal.    There were no vitals taken for this visit. Wt Readings from Last 3 Encounters:  10/11/20 164 lb 14.5 oz (74.8 kg)  01/06/20 165 lb (74.8 kg)  02/24/19 190 lb (86.2 kg)    Health Maintenance Due  Topic Date Due   COVID-19 Vaccine (1) Never done   Hepatitis C Screening  Never done   TETANUS/TDAP  Never done   PAP SMEAR-Modifier  Never done   COLONOSCOPY (Pts 45-105yrs Insurance coverage will need to be confirmed)  Never done   Zoster Vaccines- Shingrix (1 of 2) Never done   INFLUENZA VACCINE  Never done    There are no preventive care reminders to display for this patient.   Lab Results  Component Value Date   TSH 2.65 09/19/2011   Lab Results  Component Value Date   WBC 5.6 02/24/2019   HGB 14.6 02/24/2019   HCT 42.5 02/24/2019   MCV 87.1  02/24/2019   PLT 210 02/24/2019   Lab Results  Component Value Date   NA 140 02/24/2019   K 3.9 02/24/2019   CO2 30 02/24/2019   GLUCOSE 90 02/24/2019   BUN 16 02/24/2019   CREATININE 0.78 02/24/2019   BILITOT 0.7 02/24/2019   ALKPHOS 160 (H) 02/24/2019   AST 22 02/24/2019   ALT 32 02/24/2019   PROT 7.6 02/24/2019   ALBUMIN 4.2 02/24/2019   CALCIUM 9.9 02/24/2019   ANIONGAP 5 02/24/2019   Lab Results  Component Value Date   CHOL 166 09/19/2011   Lab Results  Component Value Date   HDL 38 (L) 09/19/2011   Lab Results  Component Value Date   LDLCALC 82 09/19/2011   Lab Results  Component Value Date   TRIG 231 (H) 09/19/2011   No results found for: CHOLHDL No results found for: XBWI2M     Assessment & Plan:   Problem List Items Addressed This Visit   None Visit Diagnoses     Left ear pain    -  Primary   Relevant Medications   doxycycline (VIBRA-TABS) 100 MG tablet   Dizziness       Relevant Medications   meclizine (ANTIVERT) 25 MG tablet        Meds ordered this encounter  Medications   doxycycline (VIBRA-TABS) 100 MG tablet    Sig: Take 1 tablet (100 mg total) by mouth 2 (two) times daily.    Dispense:  14 tablet    Refill:  0    Order Specific Question:   Supervising Provider    Answer:   Hyacinth Meeker, BRIAN [[id number]]   meclizine (ANTIVERT) 25 MG tablet    Sig: Take 1 tablet (25 mg total) by mouth 3 (three) times daily as needed for dizziness.    Dispense:  18 tablet    Refill:  0    Order Specific Question:   Supervising Provider    Answer:   Eber Hong [[id number]]    Informed pt that she may have concomitant out ear infection due to pain with ear manipulation-although mild. No evidence or outer ear swelling redness/swelling. Will change RX  as she she states she may have been on Augmentin in the past 2 months, and thus risks resistance. Avoided topical abx due to uncertainty about TM integrity. Advised if sxs continue to have a face to face visit. If she  develops any hearing loss or redness/swelling of the face to go to the ER Demetrio Lapping, PA-C

## 2021-02-20 ENCOUNTER — Other Ambulatory Visit: Payer: Self-pay | Admitting: Gastroenterology

## 2021-02-20 DIAGNOSIS — K219 Gastro-esophageal reflux disease without esophagitis: Secondary | ICD-10-CM

## 2021-02-20 DIAGNOSIS — R112 Nausea with vomiting, unspecified: Secondary | ICD-10-CM

## 2021-02-20 DIAGNOSIS — R1011 Right upper quadrant pain: Secondary | ICD-10-CM

## 2021-02-23 ENCOUNTER — Ambulatory Visit
Admission: RE | Admit: 2021-02-23 | Discharge: 2021-02-23 | Disposition: A | Discharge status: Home / Self Care | Payer: 59 | Source: Ambulatory Visit | Attending: Gastroenterology | Admitting: Gastroenterology

## 2021-02-23 ENCOUNTER — Other Ambulatory Visit: Payer: Self-pay

## 2021-02-23 DIAGNOSIS — K219 Gastro-esophageal reflux disease without esophagitis: Secondary | ICD-10-CM | POA: Diagnosis present

## 2021-02-23 DIAGNOSIS — R1011 Right upper quadrant pain: Secondary | ICD-10-CM | POA: Insufficient documentation

## 2021-02-23 DIAGNOSIS — R112 Nausea with vomiting, unspecified: Secondary | ICD-10-CM | POA: Diagnosis present

## 2021-02-27 ENCOUNTER — Other Ambulatory Visit: Payer: Self-pay | Admitting: Gastroenterology

## 2021-02-27 DIAGNOSIS — R1011 Right upper quadrant pain: Secondary | ICD-10-CM

## 2021-02-27 DIAGNOSIS — K219 Gastro-esophageal reflux disease without esophagitis: Secondary | ICD-10-CM

## 2021-02-27 DIAGNOSIS — R112 Nausea with vomiting, unspecified: Secondary | ICD-10-CM

## 2021-03-02 ENCOUNTER — Other Ambulatory Visit: Payer: Self-pay

## 2021-03-02 ENCOUNTER — Ambulatory Visit
Admission: RE | Admit: 2021-03-02 | Discharge: 2021-03-02 | Disposition: A | Discharge status: Home / Self Care | Payer: 59 | Source: Ambulatory Visit | Attending: Gastroenterology | Admitting: Gastroenterology

## 2021-03-02 DIAGNOSIS — K219 Gastro-esophageal reflux disease without esophagitis: Secondary | ICD-10-CM | POA: Insufficient documentation

## 2021-03-02 DIAGNOSIS — R1011 Right upper quadrant pain: Secondary | ICD-10-CM | POA: Diagnosis present

## 2021-03-02 DIAGNOSIS — R112 Nausea with vomiting, unspecified: Secondary | ICD-10-CM | POA: Diagnosis present

## 2021-03-02 MED ORDER — TECHNETIUM TC 99M MEBROFENIN IV KIT
5.0000 | PACK | Freq: Once | INTRAVENOUS | Status: AC | PRN
  Administered 2021-03-02: 5.2 via INTRAVENOUS

## 2021-03-16 ENCOUNTER — Other Ambulatory Visit: Payer: Self-pay | Admitting: Endocrinology

## 2021-03-16 DIAGNOSIS — E063 Autoimmune thyroiditis: Secondary | ICD-10-CM

## 2021-03-27 ENCOUNTER — Ambulatory Visit
Admission: RE | Admit: 2021-03-27 | Discharge: 2021-03-27 | Disposition: A | Discharge status: Home / Self Care | Payer: 59 | Source: Ambulatory Visit | Attending: Endocrinology | Admitting: Endocrinology

## 2021-03-27 ENCOUNTER — Other Ambulatory Visit: Payer: Self-pay

## 2021-03-27 DIAGNOSIS — E063 Autoimmune thyroiditis: Secondary | ICD-10-CM | POA: Insufficient documentation

## 2021-04-24 ENCOUNTER — Other Ambulatory Visit: Payer: Self-pay | Admitting: Endocrinology

## 2021-04-24 DIAGNOSIS — E041 Nontoxic single thyroid nodule: Secondary | ICD-10-CM

## 2021-05-02 ENCOUNTER — Other Ambulatory Visit (HOSPITAL_COMMUNITY)
Admission: RE | Admit: 2021-05-02 | Discharge: 2021-05-02 | Disposition: A | Discharge status: Home / Self Care | Payer: 59 | Source: Ambulatory Visit | Attending: Endocrinology | Admitting: Endocrinology

## 2021-05-02 ENCOUNTER — Ambulatory Visit
Admission: RE | Admit: 2021-05-02 | Discharge: 2021-05-02 | Disposition: A | Discharge status: Home / Self Care | Payer: 59 | Source: Ambulatory Visit | Attending: Endocrinology | Admitting: Endocrinology

## 2021-05-02 DIAGNOSIS — E041 Nontoxic single thyroid nodule: Secondary | ICD-10-CM

## 2021-05-04 LAB — CYTOLOGY - NON PAP

## 2021-05-17 ENCOUNTER — Encounter (HOSPITAL_COMMUNITY): Payer: Self-pay

## 2021-08-01 ENCOUNTER — Other Ambulatory Visit: Payer: Self-pay | Admitting: Endocrinology

## 2021-08-01 DIAGNOSIS — E041 Nontoxic single thyroid nodule: Secondary | ICD-10-CM

## 2021-09-25 ENCOUNTER — Other Ambulatory Visit: Payer: Self-pay | Admitting: Endocrinology

## 2021-09-25 DIAGNOSIS — E041 Nontoxic single thyroid nodule: Secondary | ICD-10-CM

## 2021-09-26 ENCOUNTER — Other Ambulatory Visit: Payer: Self-pay | Admitting: Endocrinology

## 2021-09-26 ENCOUNTER — Ambulatory Visit
Admission: RE | Admit: 2021-09-26 | Discharge: 2021-09-26 | Disposition: A | Discharge status: Home / Self Care | Payer: 59 | Source: Ambulatory Visit | Attending: Endocrinology | Admitting: Endocrinology

## 2021-09-26 DIAGNOSIS — E041 Nontoxic single thyroid nodule: Secondary | ICD-10-CM

## 2022-02-26 ENCOUNTER — Other Ambulatory Visit: Payer: Self-pay | Admitting: Family Medicine

## 2022-02-26 DIAGNOSIS — R9389 Abnormal findings on diagnostic imaging of other specified body structures: Secondary | ICD-10-CM

## 2022-03-02 ENCOUNTER — Ambulatory Visit
Admission: RE | Admit: 2022-03-02 | Discharge: 2022-03-02 | Disposition: A | Discharge status: Home / Self Care | Payer: 59 | Source: Ambulatory Visit | Attending: Family Medicine | Admitting: Family Medicine

## 2022-03-02 DIAGNOSIS — R9389 Abnormal findings on diagnostic imaging of other specified body structures: Secondary | ICD-10-CM | POA: Diagnosis present

## 2022-05-18 ENCOUNTER — Other Ambulatory Visit: Payer: Self-pay | Admitting: Surgery

## 2022-05-18 DIAGNOSIS — E041 Nontoxic single thyroid nodule: Secondary | ICD-10-CM

## 2022-05-18 DIAGNOSIS — E063 Autoimmune thyroiditis: Secondary | ICD-10-CM

## 2022-05-31 ENCOUNTER — Other Ambulatory Visit: Payer: 59

## 2022-06-06 ENCOUNTER — Ambulatory Visit
Admission: RE | Admit: 2022-06-06 | Discharge: 2022-06-06 | Disposition: A | Discharge status: Home / Self Care | Payer: 59 | Source: Ambulatory Visit | Attending: Surgery | Admitting: Surgery

## 2022-06-06 DIAGNOSIS — E041 Nontoxic single thyroid nodule: Secondary | ICD-10-CM

## 2022-06-06 DIAGNOSIS — E063 Autoimmune thyroiditis: Secondary | ICD-10-CM

## 2022-08-15 ENCOUNTER — Encounter: Payer: Self-pay | Admitting: Family Medicine

## 2022-08-20 ENCOUNTER — Other Ambulatory Visit: Payer: Self-pay | Admitting: Family Medicine

## 2022-08-20 DIAGNOSIS — N644 Mastodynia: Secondary | ICD-10-CM

## 2022-09-04 ENCOUNTER — Other Ambulatory Visit: Payer: Self-pay | Admitting: Family Medicine

## 2022-09-04 ENCOUNTER — Ambulatory Visit
Admission: RE | Admit: 2022-09-04 | Discharge: 2022-09-04 | Disposition: A | Discharge status: Home / Self Care | Payer: BC Managed Care – PPO | Source: Ambulatory Visit | Attending: Family Medicine | Admitting: Family Medicine

## 2022-09-04 DIAGNOSIS — N644 Mastodynia: Secondary | ICD-10-CM

## 2022-09-05 ENCOUNTER — Inpatient Hospital Stay
Admission: RE | Admit: 2022-09-05 | Discharge: 2022-09-05 | Disposition: A | Discharge status: Home / Self Care | Payer: Self-pay | Source: Ambulatory Visit | Attending: Family Medicine | Admitting: Family Medicine

## 2022-09-05 ENCOUNTER — Other Ambulatory Visit: Payer: Self-pay | Admitting: *Deleted

## 2022-09-05 DIAGNOSIS — Z1231 Encounter for screening mammogram for malignant neoplasm of breast: Secondary | ICD-10-CM

## 2022-10-11 ENCOUNTER — Other Ambulatory Visit: Payer: Self-pay | Admitting: Student

## 2022-10-11 DIAGNOSIS — R519 Headache, unspecified: Secondary | ICD-10-CM

## 2022-10-29 ENCOUNTER — Encounter: Payer: Self-pay | Admitting: Student

## 2022-10-31 ENCOUNTER — Ambulatory Visit
Admission: RE | Admit: 2022-10-31 | Discharge: 2022-10-31 | Disposition: A | Discharge status: Home / Self Care | Payer: BC Managed Care – PPO | Source: Ambulatory Visit | Attending: Student | Admitting: Student

## 2022-10-31 DIAGNOSIS — R519 Headache, unspecified: Secondary | ICD-10-CM

## 2022-11-13 ENCOUNTER — Other Ambulatory Visit: Payer: Self-pay | Admitting: Cardiology

## 2022-11-13 ENCOUNTER — Telehealth (HOSPITAL_COMMUNITY): Payer: Self-pay | Admitting: Emergency Medicine

## 2022-11-13 DIAGNOSIS — R0609 Other forms of dyspnea: Secondary | ICD-10-CM

## 2022-11-13 DIAGNOSIS — R079 Chest pain, unspecified: Secondary | ICD-10-CM

## 2022-11-13 MED ORDER — METOPROLOL TARTRATE 100 MG PO TABS: 100.0000 mg | ORAL_TABLET | Freq: Once | ORAL | 0 refills | Status: AC

## 2022-11-13 NOTE — Telephone Encounter (Signed)
Attempted to call patient regarding upcoming cardiac CT appointment. °Left message on voicemail with name and callback number °Dwan Fennel RN Navigator Cardiac Imaging °[facility] °[phone] Office °[phone] Cell ° °

## 2022-11-14 ENCOUNTER — Ambulatory Visit
Admission: RE | Admit: 2022-11-14 | Discharge: 2022-11-14 | Disposition: A | Discharge status: Home / Self Care | Payer: BC Managed Care – PPO | Source: Ambulatory Visit | Attending: Cardiology | Admitting: Cardiology

## 2022-11-14 DIAGNOSIS — R0609 Other forms of dyspnea: Secondary | ICD-10-CM | POA: Diagnosis present

## 2022-11-14 DIAGNOSIS — R079 Chest pain, unspecified: Secondary | ICD-10-CM | POA: Diagnosis present

## 2022-11-14 MED ORDER — NITROGLYCERIN 0.4 MG SL SUBL
0.8000 mg | SUBLINGUAL_TABLET | Freq: Once | SUBLINGUAL | Status: AC
  Administered 2022-11-14: 0.8 mg via SUBLINGUAL
  Filled 2022-11-14: qty 25

## 2022-11-14 MED ORDER — METOPROLOL TARTRATE 5 MG/5ML IV SOLN
INTRAVENOUS | Status: AC
  Filled 2022-11-14: qty 5

## 2022-11-14 MED ORDER — METOPROLOL TARTRATE 5 MG/5ML IV SOLN
5.0000 mg | Freq: Once | INTRAVENOUS | Status: DC
  Filled 2022-11-14: qty 5

## 2022-11-14 MED ORDER — IOHEXOL 350 MG/ML SOLN
80.0000 mL | Freq: Once | INTRAVENOUS | Status: AC | PRN
  Administered 2022-11-14: 80 mL via INTRAVENOUS

## 2022-11-14 NOTE — Progress Notes (Signed)

## 2022-11-22 ENCOUNTER — Other Ambulatory Visit: Payer: Self-pay | Admitting: Student

## 2022-11-22 DIAGNOSIS — G932 Benign intracranial hypertension: Secondary | ICD-10-CM

## 2022-11-28 NOTE — Progress Notes (Signed)
Patient for DG Lumbar Puncture on Thurs 11/29/2022, I called and spoke with the patient on the phone and gave pre-procedure instructions. Pt was made aware to be here at 9:30a and check in at the [facility] entrance. Pt stated understanding.  Called 11/28/2022

## 2022-11-29 ENCOUNTER — Other Ambulatory Visit: Payer: Self-pay

## 2022-11-29 ENCOUNTER — Ambulatory Visit
Admission: RE | Admit: 2022-11-29 | Discharge: 2022-11-29 | Disposition: A | Discharge status: Home / Self Care | Payer: BC Managed Care – PPO | Source: Ambulatory Visit | Attending: Student | Admitting: Student

## 2022-11-29 DIAGNOSIS — G932 Benign intracranial hypertension: Secondary | ICD-10-CM | POA: Diagnosis present

## 2022-11-29 MED ORDER — LIDOCAINE 1 % OPTIME INJ - NO CHARGE
5.0000 mL | Freq: Once | INTRAMUSCULAR | Status: AC
  Administered 2022-11-29: 5 mL
  Filled 2022-11-29: qty 6

## 2023-01-18 ENCOUNTER — Telehealth: Payer: BC Managed Care – PPO | Admitting: Physician Assistant

## 2023-01-18 DIAGNOSIS — J019 Acute sinusitis, unspecified: Secondary | ICD-10-CM | POA: Diagnosis not present

## 2023-01-18 DIAGNOSIS — B9689 Other specified bacterial agents as the cause of diseases classified elsewhere: Secondary | ICD-10-CM | POA: Diagnosis not present

## 2023-01-18 MED ORDER — AMOXICILLIN-POT CLAVULANATE 875-125 MG PO TABS: 1.0000 | ORAL_TABLET | Freq: Two times a day (BID) | ORAL | 0 refills | Status: DC

## 2023-01-18 NOTE — Patient Instructions (Signed)
Melanie Beltran, thank you for joining Melanie Loveless, PA-C for today's virtual visit.  While this provider is not your primary care provider (PCP), if your PCP is located in our provider database this encounter information will be shared with them immediately following your visit.   A [facility] MyChart account gives you access to today's visit and all your visits, tests, and labs performed at [facility] " click here if you don't have a [name] MyChart account or go to [email or web address]  Consent: (Patient) Melanie Beltran provided verbal consent for this virtual visit at the beginning of the encounter.  Current Medications:  Current Outpatient Medications:    amoxicillin-clavulanate (AUGMENTIN) 875-125 MG tablet, Take 1 tablet by mouth 2 (two) times daily., Disp: 20 tablet, Rfl: 0   acetaZOLAMIDE (DIAMOX) 250 MG tablet, Take 250 mg by mouth 2 (two) times daily., Disp: , Rfl:    gabapentin (NEURONTIN) 100 MG capsule, Take 100 mg by mouth 2 (two) times daily. TAKING FOR SHINGLES, Disp: , Rfl:    levothyroxine (SYNTHROID) 175 MCG tablet, Take 175 mcg by mouth daily before breakfast. , Disp: , Rfl:    meclizine (ANTIVERT) 25 MG tablet, Take 1 tablet (25 mg total) by mouth 3 (three) times daily as needed for dizziness., Disp: 18 tablet, Rfl: 0   metoprolol succinate (TOPROL-XL) 50 MG 24 hr tablet, Take 50 mg by mouth at bedtime. , Disp: , Rfl:    metoprolol tartrate (LOPRESSOR) 100 MG tablet, Take 1 tablet (100 mg total) by mouth once for 1 dose. Please take one time dose 100mg  metoprolol tartrate 2 hr prior to cardiac CT for HR control IF HR >55bpm., Disp: 1 tablet, Rfl: 0   omeprazole (PRILOSEC) 40 MG capsule, Take 40 mg by mouth daily., Disp: , Rfl:    predniSONE (DELTASONE) 10 MG tablet, Take 5 tablets p.o. on day 1 and decrease by 1 tablet daily until complete, Disp: 15 tablet, Rfl: 0   venlafaxine (EFFEXOR) 37.5 MG tablet, Take 37.5 mg by mouth 2 (two) times  daily., Disp: , Rfl:    Medications ordered in this encounter:  Meds ordered this encounter  Medications   amoxicillin-clavulanate (AUGMENTIN) 875-125 MG tablet    Sig: Take 1 tablet by mouth 2 (two) times daily.    Dispense:  20 tablet    Refill:  0    Order Specific Question:   Supervising Provider    Answer:   Merrilee Jansky [id number]     *If you need refills on other medications prior to your next appointment, please contact your pharmacy*  Follow-Up: Call back or seek an in-person evaluation if the symptoms worsen or if the condition fails to improve as anticipated.  [facility] [phone]  Other Instructions Sinus Infection, Adult A sinus infection, also called sinusitis, is inflammation of your sinuses. Sinuses are hollow spaces in the bones around your face. Your sinuses are located: Around your eyes. In the middle of your forehead. Behind your nose. In your cheekbones. Mucus normally drains out of your sinuses. When your nasal tissues become inflamed or swollen, mucus can become trapped or blocked. This allows bacteria, viruses, and fungi to grow, which leads to infection. Most infections of the sinuses are caused by a virus. A sinus infection can develop quickly. It can last for up to 4 weeks (acute) or for more than 12 weeks (chronic). A sinus infection often develops after a cold. What are the causes? This condition is  caused by anything that creates swelling in the sinuses or stops mucus from draining. This includes: Allergies. Asthma. Infection from bacteria or viruses. Deformities or blockages in your nose or sinuses. Abnormal growths in the nose (nasal polyps). Pollutants, such as chemicals or irritants in the air. Infection from fungi. This is rare. What increases the risk? You are more likely to develop this condition if you: Have a weak body defense system (immune system). Do a lot of swimming or diving. Overuse nasal  sprays. Smoke. What are the signs or symptoms? The main symptoms of this condition are pain and a feeling of pressure around the affected sinuses. Other symptoms include: Stuffy nose or congestion that makes it difficult to breathe through your nose. Thick yellow or greenish drainage from your nose. Tenderness, swelling, and warmth over the affected sinuses. A cough that may get worse at night. Decreased sense of smell and taste. Extra mucus that collects in the throat or the back of the nose (postnasal drip) causing a sore throat or bad breath. Tiredness (fatigue). Fever. How is this diagnosed? This condition is diagnosed based on: Your symptoms. Your medical history. A physical exam. Tests to find out if your condition is acute or chronic. This may include: Checking your nose for nasal polyps. Viewing your sinuses using a device that has a light (endoscope). Testing for allergies or bacteria. Imaging tests, such as an MRI or CT scan. In rare cases, a bone biopsy may be done to rule out more serious types of fungal sinus disease. How is this treated? Treatment for a sinus infection depends on the cause and whether your condition is chronic or acute. If caused by a virus, your symptoms should go away on their own within 10 days. You may be given medicines to relieve symptoms. They include: Medicines that shrink swollen nasal passages (decongestants). A spray that eases inflammation of the nostrils (topical intranasal corticosteroids). Rinses that help get rid of thick mucus in your nose (nasal saline washes). Medicines that treat allergies (antihistamines). Over-the-counter pain relievers. If caused by bacteria, your health care provider may recommend waiting to see if your symptoms improve. Most bacterial infections will get better without antibiotic medicine. You may be given antibiotics if you have: A severe infection. A weak immune system. If caused by narrow nasal passages or  nasal polyps, surgery may be needed. Follow these instructions at home: Medicines Take, use, or apply over-the-counter and prescription medicines only as told by your health care provider. These may include nasal sprays. If you were prescribed an antibiotic medicine, take it as told by your health care provider. Do not stop taking the antibiotic even if you start to feel better. Hydrate and humidify  Drink enough fluid to keep your urine pale yellow. Staying hydrated will help to thin your mucus. Use a cool mist humidifier to keep the humidity level in your home above 50%. Inhale steam for 10-15 minutes, 3-4 times a day, or as told by your health care provider. You can do this in the bathroom while a hot shower is running. Limit your exposure to cool or dry air. Rest Rest as much as possible. Sleep with your head raised (elevated). Make sure you get enough sleep each night. General instructions  Apply a warm, moist washcloth to your face 3-4 times a day or as told by your health care provider. This will help with discomfort. Use nasal saline washes as often as told by your health care provider. Wash your hands often  with soap and water to reduce your exposure to germs. If soap and water are not available, use hand sanitizer. Do not smoke. Avoid being around people who are smoking (secondhand smoke). Keep all follow-up visits. This is important. Contact a health care provider if: You have a fever. Your symptoms get worse. Your symptoms do not improve within 10 days. Get help right away if: You have a severe headache. You have persistent vomiting. You have severe pain or swelling around your face or eyes. You have vision problems. You develop confusion. Your neck is stiff. You have trouble breathing. These symptoms may be an emergency. Get help right away. Call 911. Do not wait to see if the symptoms will go away. Do not drive yourself to the hospital. Summary A sinus infection is  soreness and inflammation of your sinuses. Sinuses are hollow spaces in the bones around your face. This condition is caused by nasal tissues that become inflamed or swollen. The swelling traps or blocks the flow of mucus. This allows bacteria, viruses, and fungi to grow, which leads to infection. If you were prescribed an antibiotic medicine, take it as told by your health care provider. Do not stop taking the antibiotic even if you start to feel better. Keep all follow-up visits. This is important. This information is not intended to replace advice given to you by your health care provider. Make sure you discuss any questions you have with your health care provider. Document Revised: 01/03/2021 Document Reviewed: 01/03/2021 Elsevier Patient Education  2024 [employer]    If you have been instructed to have an in-person evaluation today at a local Urgent Care facility, please use the link below. It will take you to a list of all of our available [facility], including address, phone number and hours of operation. Please do not delay care.  [facility]  If you or a family member do not have a primary care provider, use the link below to schedule a visit and establish care. When you choose a [facility] primary care physician or advanced practice provider, you gain a long-term partner in health. Find a Primary Care Provider  Learn more about [facility]'s in-office and virtual care options: [facility] - Get Care Now

## 2023-01-18 NOTE — Progress Notes (Signed)
Virtual Visit Consent   Melanie Beltran, you are scheduled for a virtual visit with a [facility] provider today. Just as with appointments in the office, your consent must be obtained to participate. Your consent will be active for this visit and any virtual visit you may have with one of our providers in the next 365 days. If you have a MyChart account, a copy of this consent can be sent to you electronically.  As this is a virtual visit, video technology does not allow for your provider to perform a traditional examination. This may limit your provider's ability to fully assess your condition. If your provider identifies any concerns that need to be evaluated in person or the need to arrange testing (such as labs, EKG, etc.), we will make arrangements to do so. Although advances in technology are sophisticated, we cannot ensure that it will always work on either your end or our end. If the connection with a video visit is poor, the visit may have to be switched to a telephone visit. With either a video or telephone visit, we are not always able to ensure that we have a secure connection.  By engaging in this virtual visit, you consent to the provision of healthcare and authorize for your insurance to be billed (if applicable) for the services provided during this visit. Depending on your insurance coverage, you may receive a charge related to this service.  I need to obtain your verbal consent now. Are you willing to proceed with your visit today? Melanie Beltran has provided verbal consent on 01/18/2023 for a virtual visit (video or telephone). Margaretann Loveless, PA-C  Date: 01/18/2023 12:41 PM  Virtual Visit via Video Note   I, Margaretann Loveless, connected with  Melanie Beltran  ([id number], [birth year]) on 01/18/23 at 12:45 PM EST by a video-enabled telemedicine application and verified that I am speaking with the correct person using two identifiers.  Location: Patient: Virtual Visit  Location Patient: Home Provider: Virtual Visit Location Provider: Home Office   I discussed the limitations of evaluation and management by telemedicine and the availability of in person appointments. The patient expressed understanding and agreed to proceed.    History of Present Illness: Melanie Beltran is a 57 y.o. who identifies as a female who was assigned female at birth, and is being seen today for sinus congestion.  HPI: Sinusitis This is a new problem. The current episode started in the past 7 days. The problem has been gradually worsening since onset. There has been no fever. The pain is moderate. Associated symptoms include congestion, coughing (from drainage), ear pain (left ear feels full and congestion), headaches, a hoarse voice, sinus pressure and a sore throat (from drainage). Pertinent negatives include no chills. Treatments tried: mucinex. The treatment provided no relief.     Problems:  Patient Active Problem List   Diagnosis Date Noted   Hypothyroidism 12/10/2018   HTN (hypertension) 12/10/2018   Pancreatitis 12/10/2018   Acute pancreatitis 12/10/2018    Allergies:  Allergies  Allergen Reactions   Mesalamine Anaphylaxis    Throat closes   Shellfish Allergy Anaphylaxis and Nausea And Vomiting    Throat closes   Medications:  Current Outpatient Medications:    amoxicillin-clavulanate (AUGMENTIN) 875-125 MG tablet, Take 1 tablet by mouth 2 (two) times daily., Disp: 20 tablet, Rfl: 0   acetaZOLAMIDE (DIAMOX) 250 MG tablet, Take 250 mg by mouth 2 (two) times daily., Disp: , Rfl:    gabapentin (  NEURONTIN) 100 MG capsule, Take 100 mg by mouth 2 (two) times daily. TAKING FOR SHINGLES, Disp: , Rfl:    levothyroxine (SYNTHROID) 175 MCG tablet, Take 175 mcg by mouth daily before breakfast. , Disp: , Rfl:    meclizine (ANTIVERT) 25 MG tablet, Take 1 tablet (25 mg total) by mouth 3 (three) times daily as needed for dizziness., Disp: 18 tablet, Rfl: 0   metoprolol  succinate (TOPROL-XL) 50 MG 24 hr tablet, Take 50 mg by mouth at bedtime. , Disp: , Rfl:    metoprolol tartrate (LOPRESSOR) 100 MG tablet, Take 1 tablet (100 mg total) by mouth once for 1 dose. Please take one time dose 100mg  metoprolol tartrate 2 hr prior to cardiac CT for HR control IF HR >55bpm., Disp: 1 tablet, Rfl: 0   omeprazole (PRILOSEC) 40 MG capsule, Take 40 mg by mouth daily., Disp: , Rfl:    predniSONE (DELTASONE) 10 MG tablet, Take 5 tablets p.o. on day 1 and decrease by 1 tablet daily until complete, Disp: 15 tablet, Rfl: 0   venlafaxine (EFFEXOR) 37.5 MG tablet, Take 37.5 mg by mouth 2 (two) times daily., Disp: , Rfl:   Observations/Objective: Patient is well-developed, well-nourished in no acute distress.  Resting comfortably at home.  Head is normocephalic, atraumatic.  No labored breathing.  Speech is clear and coherent with logical content.  Patient is alert and oriented at baseline.    Assessment and Plan: 1. Acute bacterial sinusitis - amoxicillin-clavulanate (AUGMENTIN) 875-125 MG tablet; Take 1 tablet by mouth 2 (two) times daily.  Dispense: 20 tablet; Refill: 0  - Worsening symptoms that have not responded to OTC medications.  - Will give Augmentin - Continue allergy medications.  - Steam and humidifier can help - Stay well hydrated and get plenty of rest.  - Seek in person evaluation if no symptom improvement or if symptoms worsen   Follow Up Instructions: I discussed the assessment and treatment plan with the patient. The patient was provided an opportunity to ask questions and all were answered. The patient agreed with the plan and demonstrated an understanding of the instructions.  A copy of instructions were sent to the patient via MyChart unless otherwise noted below.    The patient was advised to call back or seek an in-person evaluation if the symptoms worsen or if the condition fails to improve as anticipated.    Margaretann Loveless, PA-C

## 2023-05-08 DIAGNOSIS — R200 Anesthesia of skin: Secondary | ICD-10-CM

## 2023-05-09 ENCOUNTER — Other Ambulatory Visit: Payer: Self-pay

## 2023-05-09 DIAGNOSIS — R200 Anesthesia of skin: Secondary | ICD-10-CM

## 2023-05-13 ENCOUNTER — Ambulatory Visit
Admission: RE | Admit: 2023-05-13 | Discharge: 2023-05-13 | Disposition: A | Discharge status: Home / Self Care | Source: Ambulatory Visit

## 2023-05-13 DIAGNOSIS — R200 Anesthesia of skin: Secondary | ICD-10-CM

## 2024-01-26 ENCOUNTER — Telehealth: Admitting: Family Medicine

## 2024-01-26 DIAGNOSIS — B9689 Other specified bacterial agents as the cause of diseases classified elsewhere: Secondary | ICD-10-CM | POA: Diagnosis not present

## 2024-01-26 DIAGNOSIS — J019 Acute sinusitis, unspecified: Secondary | ICD-10-CM

## 2024-01-26 MED ORDER — AMOXICILLIN-POT CLAVULANATE 875-125 MG PO TABS: 1.0000 | ORAL_TABLET | Freq: Two times a day (BID) | ORAL | 0 refills | Status: AC

## 2024-01-26 MED ORDER — PROMETHAZINE-DM 6.25-15 MG/5ML PO SYRP: 5.0000 mL | ORAL_SOLUTION | Freq: Four times a day (QID) | ORAL | 0 refills | Status: AC | PRN

## 2024-01-26 NOTE — Patient Instructions (Signed)

## 2024-01-26 NOTE — Progress Notes (Signed)
 Virtual Visit Consent   Melanie Beltran, you are scheduled for a virtual visit with a [facility] provider today. Just as with appointments in the office, your consent must be obtained to participate. Your consent will be active for this visit and any virtual visit you may have with one of our providers in the next 365 days. If you have a MyChart account, a copy of this consent can be sent to you electronically.  As this is a virtual visit, video technology does not allow for your provider to perform a traditional examination. This may limit your provider's ability to fully assess your condition. If your provider identifies any concerns that need to be evaluated in person or the need to arrange testing (such as labs, EKG, etc.), we will make arrangements to do so. Although advances in technology are sophisticated, we cannot ensure that it will always work on either your end or our end. If the connection with a video visit is poor, the visit may have to be switched to a telephone visit. With either a video or telephone visit, we are not always able to ensure that we have a secure connection.  By engaging in this virtual visit, you consent to the provision of healthcare and authorize for your insurance to be billed (if applicable) for the services provided during this visit. Depending on your insurance coverage, you may receive a charge related to this service.  I need to obtain your verbal consent now. Are you willing to proceed with your visit today? Melanie Beltran has provided verbal consent on 01/26/2024 for a virtual visit (video or telephone). Loa Lamp, FNP  Date: 01/26/2024 3:04 PM   Virtual Visit via Video Note   I, Loa Lamp, connected with  Melanie Beltran  ([id number], [birth year]) on 01/26/2024 at  3:15 PM EST by a video-enabled telemedicine application and verified that I am speaking with the correct person using two identifiers.  Location: Patient: Virtual Visit Location  Patient: Home Provider: Virtual Visit Location Provider: Home Office   I discussed the limitations of evaluation and management by telemedicine and the availability of in person appointments. The patient expressed understanding and agreed to proceed.    History of Present Illness: Melanie Beltran is a 58 y.o. who identifies as a female who was assigned female at birth, and is being seen today for sinus pain and pressure, post nasal drainage, headaches, no fever, no wheezing or sob. Mild cough. Sx present for over a week. OTC meds not helping.  HPI: HPI  Problems:  Patient Active Problem List   Diagnosis Date Noted   Hypothyroidism 12/10/2018   HTN (hypertension) 12/10/2018   Pancreatitis 12/10/2018   Acute pancreatitis 12/10/2018    Allergies: Allergies[1] Medications: Current Medications[2]  Observations/Objective: Patient is well-developed, well-nourished in no acute distress.  Resting comfortably  at home.  Head is normocephalic, atraumatic.  No labored breathing.  Speech is clear and coherent with logical content.  Patient is alert and oriented at baseline.    Assessment and Plan: 1. Acute bacterial sinusitis - amoxicillin -clavulanate (AUGMENTIN ) 875-125 MG tablet; Take 1 tablet by mouth 2 (two) times daily.  Dispense: 20 tablet; Refill: 0  Increase fluids, humidifier at night, tylenol  or ibuprofen  as directed, UC as needed.   Follow Up Instructions: I discussed the assessment and treatment plan with the patient. The patient was provided an opportunity to ask questions and all were answered. The patient agreed with the plan and demonstrated an understanding of  the instructions.  A copy of instructions were sent to the patient via MyChart unless otherwise noted below.     The patient was advised to call back or seek an in-person evaluation if the symptoms worsen or if the condition fails to improve as anticipated.    Melanie Eroh, FNP     [1]  Allergies Allergen  Reactions   Mesalamine Anaphylaxis    Throat closes   Shellfish Allergy Anaphylaxis and Nausea And Vomiting    Throat closes  [2]  Current Outpatient Medications:    promethazine -dextromethorphan (PROMETHAZINE -DM) 6.25-15 MG/5ML syrup, Take 5 mLs by mouth 4 (four) times daily as needed for up to 10 days for cough., Disp: 118 mL, Rfl: 0   acetaZOLAMIDE (DIAMOX) 250 MG tablet, Take 250 mg by mouth 2 (two) times daily., Disp: , Rfl:    amoxicillin -clavulanate (AUGMENTIN ) 875-125 MG tablet, Take 1 tablet by mouth 2 (two) times daily., Disp: 20 tablet, Rfl: 0   gabapentin  (NEURONTIN ) 100 MG capsule, Take 100 mg by mouth 2 (two) times daily. TAKING FOR SHINGLES, Disp: , Rfl:    levothyroxine  (SYNTHROID ) 175 MCG tablet, Take 175 mcg by mouth daily before breakfast. , Disp: , Rfl:    meclizine  (ANTIVERT ) 25 MG tablet, Take 1 tablet (25 mg total) by mouth 3 (three) times daily as needed for dizziness., Disp: 18 tablet, Rfl: 0   metoprolol  succinate (TOPROL -XL) 50 MG 24 hr tablet, Take 50 mg by mouth at bedtime. , Disp: , Rfl:    metoprolol  tartrate (LOPRESSOR ) 100 MG tablet, Take 1 tablet (100 mg total) by mouth once for 1 dose. Please take one time dose 100mg  metoprolol  tartrate 2 hr prior to cardiac CT for HR control IF HR >55bpm., Disp: 1 tablet, Rfl: 0   omeprazole (PRILOSEC) 40 MG capsule, Take 40 mg by mouth daily., Disp: , Rfl:    predniSONE  (DELTASONE ) 10 MG tablet, Take 5 tablets p.o. on day 1 and decrease by 1 tablet daily until complete, Disp: 15 tablet, Rfl: 0   venlafaxine (EFFEXOR) 37.5 MG tablet, Take 37.5 mg by mouth 2 (two) times daily., Disp: , Rfl:

## 2024-03-04 ENCOUNTER — Other Ambulatory Visit: Payer: Self-pay

## 2024-03-04 DIAGNOSIS — G43E19 Chronic migraine with aura, intractable, without status migrainosus: Secondary | ICD-10-CM

## 2024-03-10 ENCOUNTER — Ambulatory Visit: Admission: RE | Admit: 2024-03-10 | Discharge: 2024-03-10 | Disposition: A | Source: Ambulatory Visit

## 2024-03-10 DIAGNOSIS — G43E19 Chronic migraine with aura, intractable, without status migrainosus: Secondary | ICD-10-CM
